# Patient Record
Sex: Male | Born: 1980 | Race: Black or African American | Hispanic: No | Smoking: Current every day smoker
Health system: Southern US, Community
[De-identification: ages and names within clinical notes are randomized; demographics above are authoritative.]

## PROBLEM LIST (undated history)

## (undated) DIAGNOSIS — R519 Headache, unspecified: Secondary | ICD-10-CM

## (undated) DIAGNOSIS — G43909 Migraine, unspecified, not intractable, without status migrainosus: Secondary | ICD-10-CM

## (undated) DIAGNOSIS — R51 Headache: Secondary | ICD-10-CM

## (undated) DIAGNOSIS — M549 Dorsalgia, unspecified: Secondary | ICD-10-CM

## (undated) HISTORY — PX: NO PAST SURGERIES: SHX2092

---

## 2013-07-05 ENCOUNTER — Encounter (HOSPITAL_COMMUNITY): Payer: Self-pay | Admitting: Emergency Medicine

## 2013-07-05 ENCOUNTER — Emergency Department (HOSPITAL_COMMUNITY)
Admission: EM | Admit: 2013-07-05 | Discharge: 2013-07-05 | Disposition: A | Discharge status: Home / Self Care | Payer: 59 | Attending: Emergency Medicine | Admitting: Emergency Medicine

## 2013-07-05 DIAGNOSIS — L02411 Cutaneous abscess of right axilla: Secondary | ICD-10-CM

## 2013-07-05 DIAGNOSIS — IMO0002 Reserved for concepts with insufficient information to code with codable children: Secondary | ICD-10-CM | POA: Insufficient documentation

## 2013-07-05 DIAGNOSIS — F17200 Nicotine dependence, unspecified, uncomplicated: Secondary | ICD-10-CM | POA: Insufficient documentation

## 2013-07-05 MED ORDER — SULFAMETHOXAZOLE-TRIMETHOPRIM 800-160 MG PO TABS: 1.0000 | ORAL_TABLET | Freq: Two times a day (BID) | ORAL | Status: DC

## 2013-07-05 MED ORDER — CEPHALEXIN 500 MG PO CAPS: 500.0000 mg | ORAL_CAPSULE | Freq: Four times a day (QID) | ORAL | Status: DC

## 2013-07-05 MED ORDER — HYDROCODONE-ACETAMINOPHEN 5-325 MG PO TABS: 1.0000 | ORAL_TABLET | ORAL | Status: DC | PRN

## 2013-07-05 NOTE — ED Provider Notes (Signed)
CSN: [id number]     Arrival date & time 07/05/13  1833 History   First MD Initiated Contact with Patient 07/05/13 1846     Chief Complaint  Patient presents with  . Abscess    HPI  Dylan Gill is a 32 y.o. male with no PMH who presents to the ED for evaluation of an abscess.  History was provided by the patient.  Patient states he has had small abscesses to the right axilla for the past two weeks.  He states that they have been enlarging, bursting, and then resolving spontaneously.  He states he developed another abscess to the right axilla yesterday.  The area is red, warm, and painful to the touch.  He denies any previous wounds or injuries.  No drainage or fever.  He has had abscesses in the past to his back which were surgically managed.  He has not been on antibiotics for the past few months.  He denise any history of diabetes.  Tetanus up to date per patient.  No weakness, loss of sensation, numbness, tingling, spreading erythema/edema, change in appetite/activity, abdominal pain, nausea, emesis, diarrhea, or leg edema.  He recently moved here and has financial concerns for his healthcare.     History reviewed. No pertinent past medical history. History reviewed. No pertinent past surgical history. No family history on file. History  Substance Use Topics  . Smoking status: Current Every Day Smoker  . Smokeless tobacco: Not on file  . Alcohol Use: No    Review of Systems  Constitutional: Negative for fever, chills, activity change, appetite change and fatigue.  HENT: Negative for mouth sores, trouble swallowing and voice change.   Eyes: Negative for visual disturbance.  Respiratory: Negative for cough and shortness of breath.   Cardiovascular: Negative for chest pain and leg swelling.  Gastrointestinal: Negative for nausea, vomiting, abdominal pain and diarrhea.  Genitourinary: Negative for dysuria.  Musculoskeletal: Negative for arthralgias, back pain, joint swelling, myalgias  and neck pain.  Skin: Positive for color change. Negative for wound.  Neurological: Negative for dizziness, syncope, weakness, light-headedness, numbness and headaches.  Psychiatric/Behavioral: Negative for confusion.    Allergies  Review of patient's allergies indicates no known allergies.  Home Medications  No current outpatient prescriptions on file. BP 138/77  Pulse 89  Temp(Src) 98.9 F (37.2 C) (Oral)  Resp 18  Wt 252 lb 4 oz (114.42 kg)  SpO2 97%  Filed Vitals:   07/05/13 1838 07/05/13 2009  BP: 138/77 120/77  Pulse: 89 77  Temp: 98.9 F (37.2 C) 97.9 F (36.6 C)  TempSrc: Oral Oral  Resp: 18 18  Weight: 252 lb 4 oz (114.42 kg)   SpO2: 97% 97%    Physical Exam  Nursing note and vitals reviewed. Constitutional: He is oriented to person, place, and time. He appears well-developed and well-nourished. No distress.  HENT:  Head: Normocephalic and atraumatic.  Right Ear: External ear normal.  Left Ear: External ear normal.  Nose: Nose normal.  Mouth/Throat: Oropharynx is clear and moist.  Eyes: Conjunctivae are normal. Pupils are equal, round, and reactive to light. Right eye exhibits no discharge. Left eye exhibits no discharge.  Neck: Normal range of motion. Neck supple.  Cardiovascular: Normal rate, regular rhythm, normal heart sounds and intact distal pulses.  Exam reveals no gallop and no friction rub.   No murmur heard. Radial pulses present bilaterally  Pulmonary/Chest: Effort normal and breath sounds normal. No respiratory distress. He has no wheezes. He has no  rales. He exhibits no tenderness.  Abdominal: Soft. He exhibits no distension. There is no tenderness.  Musculoskeletal: Normal range of motion. He exhibits no edema and no tenderness.  Strength 5/5 in the UE bilaterally  Neurological: He is alert and oriented to person, place, and time.  Sensation intact in the UE bilaterally  Skin: Skin is warm and dry. He is not diaphoretic. There is erythema.   1 cm x 1 cm circular mobile mass with overlying erythema to the right axilla.  Area is indurated with no fluctuance or open wounds.  Area is tender to palpation.      ED Course  Procedures (including critical care time) Labs Review Labs Reviewed - No data to display Imaging Review No results found.  EKG Interpretation   None       MDM   1. Abscess of right axilla     Dylan Gill is a 32 y.o. male with no PMH who presents to the ED for evaluation of an abscess.   Patient likely has a developing abscess to the right axilla.  Area is indurated with no area of fluctuance or open wounds.  I did not feel that I&D was indicated at this time.  Patient was prescribed Keflex and Bactrim for OP management and instructed to apply warm compresses.  He was given referral to surgery for OP management.  He was instructed to follow-up for wound recheck in two days.  Patient was non-toxic, afebrile, and remained in no acute distress throughout his ED visit.  He was also given a prescription for Norco for severe pain.  Patient was instructed to return to the ED if they experience any spreading redness/swelling, fever, severe pain, drainage of pus, or other concerns. Patient was in agreement with discharge and plan.     Final impressions: 1. Abscess, right axilla     Greer Ee Delaynie Stetzer PA-C         Jillyn Ledger, PA-C 07/06/13 1204

## 2013-07-05 NOTE — ED Notes (Signed)
Pt is here with abscess areas under right arm.

## 2013-07-10 NOTE — ED Provider Notes (Signed)
Medical screening examination/treatment/procedure(s) were performed by non-physician practitioner and as supervising physician I was immediately available for consultation/collaboration.  EKG Interpretation   None         Roney Marion, MD 07/10/13 1100

## 2015-08-31 ENCOUNTER — Emergency Department (HOSPITAL_COMMUNITY): Payer: Medicaid Other

## 2015-08-31 ENCOUNTER — Emergency Department (HOSPITAL_COMMUNITY)
Admission: EM | Admit: 2015-08-31 | Discharge: 2015-08-31 | Disposition: A | Discharge status: Home / Self Care | Payer: Medicaid Other | Attending: Emergency Medicine | Admitting: Emergency Medicine

## 2015-08-31 ENCOUNTER — Encounter (HOSPITAL_COMMUNITY): Payer: Self-pay | Admitting: *Deleted

## 2015-08-31 DIAGNOSIS — F17200 Nicotine dependence, unspecified, uncomplicated: Secondary | ICD-10-CM | POA: Insufficient documentation

## 2015-08-31 DIAGNOSIS — Z792 Long term (current) use of antibiotics: Secondary | ICD-10-CM | POA: Insufficient documentation

## 2015-08-31 DIAGNOSIS — M545 Low back pain, unspecified: Secondary | ICD-10-CM

## 2015-08-31 HISTORY — DX: Dorsalgia, unspecified: M54.9

## 2015-08-31 MED ORDER — CYCLOBENZAPRINE HCL 10 MG PO TABS: 10.0000 mg | ORAL_TABLET | Freq: Three times a day (TID) | ORAL | Status: DC | PRN

## 2015-08-31 MED ORDER — IBUPROFEN 800 MG PO TABS
800.0000 mg | ORAL_TABLET | Freq: Once | ORAL | Status: AC
  Administered 2015-08-31: 800 mg via ORAL
  Filled 2015-08-31: qty 1

## 2015-08-31 MED ORDER — IBUPROFEN 800 MG PO TABS: 800.0000 mg | ORAL_TABLET | Freq: Three times a day (TID) | ORAL | Status: DC | PRN

## 2015-08-31 NOTE — ED Notes (Signed)
Patient presents with c/o lower back pain

## 2015-08-31 NOTE — ED Notes (Signed)
MD at bedside. 

## 2015-08-31 NOTE — Discharge Instructions (Signed)
Return for worsening symptoms, including worsening pain, inability to walk, numbness/weakness of legs, loss of control of bowel or bladder or any other symptoms concerning to you. 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 You are given neurosurgery follow-up as needed for this.  Back Exercises If you have pain in your back, do these exercises 2-3 times each day or as told by your doctor. When the pain goes away, do the exercises once each day, but repeat the steps more times for each exercise (do more repetitions). If you do not have pain in your back, do these exercises once each day or as told by your doctor. EXERCISES Single Knee to Chest Do these steps 3-5 times in a row for each leg:  Lie on your back on a firm bed or the floor with your legs stretched out.  Bring one knee to your chest.  Hold your knee to your chest by grabbing your knee or thigh.  Pull on your knee until you feel a gentle stretch in your lower back.  Keep doing the stretch for 10-30 seconds.  Slowly let go of your leg and straighten it. Pelvic Tilt Do these steps 5-10 times in a row:  Lie on your back on a firm bed or the floor with your legs stretched out.  Bend your knees so they point up to the ceiling. Your feet should be flat on the floor.  Tighten your lower belly (abdomen) muscles to press your lower back against the floor. This will make your tailbone point up to the ceiling instead of pointing down to your feet or the floor.  Stay in this position for 5-10 seconds while you gently tighten your muscles and breathe evenly. Cat-Cow Do these steps until your lower back bends more easily:  Get on your hands and knees on a firm surface. Keep your hands under your shoulders, and keep your knees under your hips. You may put padding under your knees.  Let your head hang down, and make your tailbone point down to  the floor so your lower back is round like the back of a cat.  Stay in this position for 5 seconds.  Slowly lift your head and make your tailbone point up to the ceiling so your back hangs low (sags) like the back of a cow.  Stay in this position for 5 seconds. Press-Ups Do these steps 5-10 times in a row: 1. Lie on your belly (face-down) on the floor. 2. Place your hands near your head, about shoulder-width apart. 3. While you keep your back relaxed and keep your hips on the floor, slowly straighten your arms to raise the top half of your body and lift your shoulders. Do not use your back muscles. To make yourself more comfortable, you may change where you place your hands. 4. Stay in this position for 5 seconds. 5. Slowly return to lying flat on the floor. Bridges Do these steps 10 times in a row: 1. Lie on your back on a firm surface. 2. Bend your knees so they point up to the ceiling. Your feet should be flat on the floor. 3. Tighten your butt muscles and lift your butt off of the floor until your waist is almost as high as your knees. If you do not feel the muscles working in your butt and the back of your thighs, slide your feet 1-2 inches farther away  from your butt. 4. Stay in this position for 3-5 seconds. 5. Slowly lower your butt to the floor, and let your butt muscles relax. If this exercise is too easy, try doing it with your arms crossed over your chest. Belly Crunches Do these steps 5-10 times in a row: 1. Lie on your back on a firm bed or the floor with your legs stretched out. 2. Bend your knees so they point up to the ceiling. Your feet should be flat on the floor. 3. Cross your arms over your chest. 4. Tip your chin a little bit toward your chest but do not bend your neck. 5. Tighten your belly muscles and slowly raise your chest just enough to lift your shoulder blades a tiny bit off of the floor. 6. Slowly lower your chest and your head to the floor. Back Lifts Do  these steps 5-10 times in a row: 1. Lie on your belly (face-down) with your arms at your sides, and rest your forehead on the floor. 2. Tighten the muscles in your legs and your butt. 3. Slowly lift your chest off of the floor while you keep your hips on the floor. Keep the back of your head in line with the curve in your back. Look at the floor while you do this. 4. Stay in this position for 3-5 seconds. 5. Slowly lower your chest and your face to the floor. GET HELP IF:  Your back pain gets a lot worse when you do an exercise.  Your back pain does not lessen 2 hours after you exercise. If you have any of these problems, stop doing the exercises. Do not do them again unless your doctor says it is okay. GET HELP RIGHT AWAY IF:  You have sudden, very bad back pain. If this happens, stop doing the exercises. Do not do them again unless your doctor says it is okay.   This information is not intended to replace advice given to you by your health care provider. Make sure you discuss any questions you have with your health care provider.   Document Released: 09/28/2010 Document Revised: 05/17/2015 Document Reviewed: 10/20/2014 Elsevier Interactive Patient Education 2016 [employer]  Back Pain, Adult Back pain is very common. The pain often gets better over time. The cause of back pain is usually not dangerous. Most people can learn to manage their back pain on their own.  HOME CARE  Watch your back pain for any changes. The following actions may help to lessen any pain you are feeling:  Stay active. Start with short walks on flat ground if you can. Try to walk farther each day.  Exercise regularly as told by your doctor. Exercise helps your back heal faster. It also helps avoid future injury by keeping your muscles strong and flexible.  Do not sit, drive, or stand in one place for more than 30 minutes.  Do not stay in bed. Resting more than 1-2 days can slow down your recovery.  Be  careful when you bend or lift an object. Use good form when lifting:  Bend at your knees.  Keep the object close to your body.  Do not twist.  Sleep on a firm mattress. Lie on your side, and bend your knees. If you lie on your back, put a pillow under your knees.  Take medicines only as told by your doctor.  Put ice on the injured area.  Put ice in a plastic bag.  Place a towel between your  skin and the bag.  Leave the ice on for 20 minutes, 2-3 times a day for the first 2-3 days. After that, you can switch between ice and heat packs.  Avoid feeling anxious or stressed. Find good ways to deal with stress, such as exercise.  Maintain a healthy weight. Extra weight puts stress on your back. GET HELP IF:   You have pain that does not go away with rest or medicine.  You have worsening pain that goes down into your legs or buttocks.  You have pain that does not get better in one week.  You have pain at night.  You lose weight.  You have a fever or chills. GET HELP RIGHT AWAY IF:   You cannot control when you poop (bowel movement) or pee (urinate).  Your arms or legs feel weak.  Your arms or legs lose feeling (numbness).  You feel sick to your stomach (nauseous) or throw up (vomit).  You have belly (abdominal) pain.  You feel like you may pass out (faint).   This information is not intended to replace advice given to you by your health care provider. Make sure you discuss any questions you have with your health care provider.   Document Released: 02/12/2008 Document Revised: 09/16/2014 Document Reviewed: 12/28/2013 Elsevier Interactive Patient Education [employer] Inc[employer]

## 2015-08-31 NOTE — ED Provider Notes (Signed)
CSN: [id number]     Arrival date & time 08/31/15  0234 History   By signing my name below, I, Arlan Organ, attest that this documentation has been prepared under the direction and in the presence of Lavera Guise, MD.  Electronically Signed: Arlan Organ, ED Scribe. 08/31/2015. 2:47 AM.   Chief Complaint  Patient presents with  . Back Pain   The history is provided by the patient. No language interpreter was used.    HPI Comments: Dylan Gill is a 34 y.o. male with a PMHx of back pain who presents to the Emergency Department complaining of intermittent, ongoing lower back pain x 2-3 months. He denies any recent heavy lifting but states he is pretty active at work as he is on his feet often. Frequently lifts and carries his daughter around at home as well. No aggravating or alleviating factors at this time. OTC Tylenol attempted prior to arrival with mild temporary improvement. No recent fever ,chills, night sweats, nausea, vomiting, abdominal pain, diarrhea, or dysuria. No numbness, loss of sensation, or weakness. No bowel or urinary incontinence or urinary retention.  PCP: No PCP Per Patient    Past Medical History  Diagnosis Date  . Back pain    History reviewed. No pertinent past surgical history. History reviewed. No pertinent family history. Social History  Substance Use Topics  . Smoking status: Current Every Day Smoker  . Smokeless tobacco: Never Used  . Alcohol Use: Yes    Review of Systems  Constitutional: Negative for fever and chills.  Gastrointestinal: Negative for nausea, vomiting and abdominal pain.  Genitourinary: Negative for dysuria.  Musculoskeletal: Positive for back pain.  Neurological: Negative for weakness and numbness.  All other systems reviewed and are negative.     Allergies  Review of patient's allergies indicates no known allergies.  Home Medications   Prior to Admission medications   Medication Sig Start Date End Date Taking? Authorizing  Provider  cephALEXin (KEFLEX) 500 MG capsule Take 1 capsule (500 mg total) by mouth 4 (four) times daily. 07/05/13   Jillyn Ledger, PA-C  cyclobenzaprine (FLEXERIL) 10 MG tablet Take 1 tablet (10 mg total) by mouth 3 (three) times daily as needed for muscle spasms. 08/31/15   Lavera Guise, MD  HYDROcodone-acetaminophen (NORCO/VICODIN) 5-325 MG per tablet Take 1-2 tablets by mouth every 4 (four) hours as needed for pain. 07/05/13   Jillyn Ledger, PA-C  ibuprofen (ADVIL,MOTRIN) 800 MG tablet Take 1 tablet (800 mg total) by mouth every 8 (eight) hours as needed for moderate pain. Take with food/crackers 08/31/15   Lavera Guise, MD  sulfamethoxazole-trimethoprim (SEPTRA DS) 800-160 MG per tablet Take 1 tablet by mouth 2 (two) times daily. 07/05/13   Jillyn Ledger, PA-C   Triage Vitals: BP 126/78 mmHg  Pulse 86  Temp(Src) 98.9 F (37.2 C)  Resp 16  SpO2 100%   Physical Exam  Constitutional: He is oriented to person, place, and time. He appears well-developed and well-nourished.  HENT:  Head: Normocephalic and atraumatic.  Eyes: EOM are normal.  Neck: Normal range of motion.  Cardiovascular: Normal rate, regular rhythm, normal heart sounds and intact distal pulses.   Pulmonary/Chest: Effort normal and breath sounds normal. No respiratory distress.  Abdominal: Soft. Bowel sounds are normal. He exhibits no distension. There is no tenderness.  Musculoskeletal: Normal range of motion.  Midline and bilateral paraspinal tenderness involving the lumbar spine No stepoff or deformity  Neurological: He is alert and oriented to  person, place, and time.  Reflex Scores:      Patellar reflexes are 2+ on the right side and 2+ on the left side.      Achilles reflexes are 2+ on the right side and 2+ on the left side. Full sensation to light touch of the bilateral lower extremities Full strength to hips with flexion, extension, abduction, and adduction Full strength of knee flexion, dorsiflexion,  and plantar flexion bilaterally  Skin: Skin is warm and dry.  Psychiatric: He has a normal mood and affect. Judgment normal.  Nursing note and vitals reviewed.   ED Course  Procedures (including critical care time)  DIAGNOSTIC STUDIES: Oxygen Saturation is 100% on RA, Normal by my interpretation.    COORDINATION OF CARE: 2:43 AM- Will give Advil. Will order DG lumbar spine complete. Discussed treatment plan with pt at bedside and pt agreed to plan.     Labs Review Labs Reviewed - No data to display  Imaging Review Dg Lumbar Spine Complete  08/31/2015  CLINICAL DATA:  Lower back pain for months. EXAM: LUMBAR SPINE - COMPLETE 4+ VIEW COMPARISON:  None. FINDINGS: No evidence of acute fracture or traumatic malalignment. Absent/small right twelfth rib. No evidence of bone lesion or endplate erosion. 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 IMPRESSION: 1. No acute finding. 2. 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 Correlate for symptoms of Bertolotti's syndrome. Electronically Signed   By: Marnee SpringJonathon  Watts M.D.   On: 08/31/2015 03:34   I have personally reviewed and evaluated these images and lab results as part of my medical decision-making.   MDM   Final diagnoses:  Bilateral low back pain without sciatica    34 year old male who presents with several months of ongoing low back pain. He is well-appearing and in no acute distress. Has unremarkable vital signs. Is neurovascularly intact in his lower extremities. Presentation seems consistent with likely low back strain. No concern for serious or life-threatening cause of his symptoms such as spinal cord involvement, infection, malignancy, bleeding, intra-abdominal or retroperitoneal process. Given ongoing symptoms now for almost 3 months, x-ray of his lumbar spine was performed. Findings may suggest possible Bertolotti's  syndrome, and he is given neurosurgery referral as needed. I discussed supportive care instructions for home. Strict return and follow-up instructions are reviewed. He expressed understanding of all discharge instructions and felt comfortable with the plan of care.  I personally performed the services described in this documentation, which was scribed in my presence. The recorded information has been reviewed and is accurate.   Lavera Guiseana Duo Dima Mini, MD 08/31/15 [id number]

## 2015-08-31 NOTE — ED Notes (Signed)
Patient transported to X-ray 

## 2016-05-13 ENCOUNTER — Encounter (HOSPITAL_COMMUNITY): Payer: Self-pay

## 2016-05-13 ENCOUNTER — Emergency Department (HOSPITAL_COMMUNITY)
Admission: EM | Admit: 2016-05-13 | Discharge: 2016-05-13 | Disposition: A | Discharge status: Home / Self Care | Payer: Medicaid Other | Attending: Emergency Medicine | Admitting: Emergency Medicine

## 2016-05-13 DIAGNOSIS — F17210 Nicotine dependence, cigarettes, uncomplicated: Secondary | ICD-10-CM | POA: Insufficient documentation

## 2016-05-13 DIAGNOSIS — Y929 Unspecified place or not applicable: Secondary | ICD-10-CM | POA: Insufficient documentation

## 2016-05-13 DIAGNOSIS — Y939 Activity, unspecified: Secondary | ICD-10-CM | POA: Diagnosis not present

## 2016-05-13 DIAGNOSIS — Y999 Unspecified external cause status: Secondary | ICD-10-CM | POA: Diagnosis not present

## 2016-05-13 DIAGNOSIS — T23202A Burn of second degree of left hand, unspecified site, initial encounter: Secondary | ICD-10-CM | POA: Diagnosis present

## 2016-05-13 DIAGNOSIS — X12XXXA Contact with other hot fluids, initial encounter: Secondary | ICD-10-CM | POA: Insufficient documentation

## 2016-05-13 DIAGNOSIS — Z23 Encounter for immunization: Secondary | ICD-10-CM | POA: Insufficient documentation

## 2016-05-13 MED ORDER — SILVER SULFADIAZINE 1 % EX CREA
TOPICAL_CREAM | Freq: Once | CUTANEOUS | Status: AC
  Administered 2016-05-13: 22:00:00 via TOPICAL
  Filled 2016-05-13: qty 85

## 2016-05-13 MED ORDER — KETOROLAC TROMETHAMINE 30 MG/ML IJ SOLN
30.0000 mg | Freq: Once | INTRAMUSCULAR | Status: AC
  Administered 2016-05-13: 30 mg via INTRAMUSCULAR
  Filled 2016-05-13: qty 1

## 2016-05-13 MED ORDER — SILVER SULFADIAZINE 1 % EX CREA: 1.0000 "application " | TOPICAL_CREAM | Freq: Two times a day (BID) | CUTANEOUS | 0 refills | Status: DC

## 2016-05-13 MED ORDER — TETANUS-DIPHTH-ACELL PERTUSSIS 5-2.5-18.5 LF-MCG/0.5 IM SUSP
0.5000 mL | Freq: Once | INTRAMUSCULAR | Status: AC
  Administered 2016-05-13: 0.5 mL via INTRAMUSCULAR
  Filled 2016-05-13: qty 0.5

## 2016-05-13 MED ORDER — KETOROLAC TROMETHAMINE 10 MG PO TABS: 10.0000 mg | ORAL_TABLET | Freq: Four times a day (QID) | ORAL | 0 refills | Status: DC | PRN

## 2016-05-13 MED ORDER — HYDROCODONE-ACETAMINOPHEN 5-325 MG PO TABS: 1.0000 | ORAL_TABLET | ORAL | 0 refills | Status: DC | PRN
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
## 2016-05-13 NOTE — ED Notes (Signed)
No reaction noted.
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
## 2017-02-05 ENCOUNTER — Emergency Department (HOSPITAL_COMMUNITY)
Admission: EM | Admit: 2017-02-05 | Discharge: 2017-02-05 | Disposition: A | Discharge status: Home / Self Care | Payer: Medicaid Other | Attending: Emergency Medicine | Admitting: Emergency Medicine

## 2017-02-05 ENCOUNTER — Encounter (HOSPITAL_COMMUNITY): Payer: Self-pay | Admitting: Emergency Medicine

## 2017-02-05 DIAGNOSIS — L732 Hidradenitis suppurativa: Secondary | ICD-10-CM | POA: Insufficient documentation

## 2017-02-05 DIAGNOSIS — R229 Localized swelling, mass and lump, unspecified: Secondary | ICD-10-CM | POA: Diagnosis present

## 2017-02-05 DIAGNOSIS — F17210 Nicotine dependence, cigarettes, uncomplicated: Secondary | ICD-10-CM | POA: Diagnosis not present

## 2017-02-05 MED ORDER — CLINDAMYCIN PHOSPHATE 1 % EX SOLN: Freq: Two times a day (BID) | CUTANEOUS | 0 refills | Status: DC

## 2017-02-05 MED ORDER — LIDOCAINE-EPINEPHRINE (PF) 2 %-1:200000 IJ SOLN: 10.0000 mL | Freq: Once | INTRAMUSCULAR | Status: DC

## 2017-02-05 MED ORDER — HYDROCODONE-ACETAMINOPHEN 5-325 MG PO TABS: 1.0000 | ORAL_TABLET | Freq: Four times a day (QID) | ORAL | 0 refills | Status: DC | PRN

## 2017-02-05 NOTE — ED Triage Notes (Signed)
Denies fevers

## 2017-02-05 NOTE — Discharge Instructions (Signed)
You may have a condition called hydradenitis. Use the clindamycin solution twice a day applied to the area. May use ibuprofen, naproxen, or Tylenol for pain. Vicodin for severe pain. Do not drive or perform other dangerous activities while taking the Vicodin. Follow-up with dermatology as soon as possible. Call one of the numbers included in this packet to set up an appointment. You may also find your own dermatologist. Establishing care with this specialist is very important for care of this sometimes complex condition.

## 2017-02-05 NOTE — ED Triage Notes (Signed)
Pt to ER for abscess under left axilla x "weeks." pt reports minimal drainage.

## 2017-02-05 NOTE — ED Provider Notes (Signed)
[facility] Provider Note   CSN: [id number] Arrival date & time: 02/05/17  1257  By signing my name below, I, Rosario AdieWilliam Andrew Hiatt, attest that this documentation has been prepared under the direction and in the presence of Shawn Joy, PA-C.  Electronically Signed: Rosario AdieWilliam Andrew Hiatt, ED Scribe. 02/05/17. 3:09 PM.  History   Chief Complaint Chief Complaint  Patient presents with  . Abscess   The history is provided by the patient. No language interpreter was used.    HPI Comments: Dylan Gill is a 36 y.o. male who presents to the Emergency Department complaining of multiple gradually worsening area of pain and swelling to the left axilla onset 2 weeks ago. He reports that one of these areas began draining several days ago, but the other areas have not. He has a h/o prior abscesses to the area and states that his symptoms today are consistent with this. Has also had similar eruptions on his back. Pt states pain is exacerbated with palpation. No noted treatments for his symptoms were tried prior to coming into the ED. Denies fever, chills, or any other associated symptoms. Tetanus is UTD.    Patient denies similar areas of swelling to the groin, right axilla, or other body areas.   Past Medical History:  Diagnosis Date  . Back pain    There are no active problems to display for this patient.  History reviewed. No pertinent surgical history.   Home Medications    Prior to Admission medications   Medication Sig Start Date End Date Taking? Authorizing Provider  acetaminophen (TYLENOL) 650 MG CR tablet Take 1,300 mg by mouth every 8 (eight) hours as needed for pain.   Yes [provider]  clindamycin (CLEOCIN-T) 1 % external solution Apply topically 2 (two) times daily. 02/05/17   Joy, Shawn C, PA-C  HYDROcodone-acetaminophen (NORCO/VICODIN) 5-325 MG tablet Take 1 tablet by mouth every 6 (six) hours as needed. 02/05/17   Joy, Shawn C, PA-C  ketorolac (TORADOL) 10  MG tablet Take 1 tablet (10 mg total) by mouth every 6 (six) hours as needed. Patient not taking: Reported on 02/05/2017 05/13/16   Jacalyn LefevreHaviland, Julie, MD  silver sulfADIAZINE (SILVADENE) 1 % cream Apply 1 application topically 2 (two) times daily. Apply to left hand Patient not taking: Reported on 02/05/2017 05/13/16   Jacalyn LefevreHaviland, Julie, MD    Family History History reviewed. No pertinent family history.  Social History Social History  Substance Use Topics  . Smoking status: Current Every Day Smoker    Packs/day: 1.00    Types: Cigarettes  . Smokeless tobacco: Never Used  . Alcohol use Yes     Comment: occasiona.    Allergies   Patient has no known allergies.  Review of Systems Review of Systems  Constitutional: Negative for chills and fever.  Skin: Negative for wound.       +areas of pain and swelling to the left axilla   Physical Exam Updated Vital Signs BP 129/71 (BP Location: Right Arm)   Pulse 68   Temp 98.1 F (36.7 C) (Oral)   Resp 16   SpO2 98%   Physical Exam  Constitutional: He appears well-developed and well-nourished. No distress.  HENT:  Head: Normocephalic and atraumatic.  Eyes: Conjunctivae are normal.  Neck: Neck supple.  Cardiovascular: Normal rate, regular rhythm and intact distal pulses.   Pulmonary/Chest: Effort normal.  Lymphadenopathy:    He has no cervical adenopathy.  Neurological: He is alert.  Skin: Skin is warm and  dry. He is not diaphoretic.  Multiple tender, indurated masses in the left axilla. Areas are firm. No areas of fluctuance noted.  Psychiatric: He has a normal mood and affect. His behavior is normal.  Nursing note and vitals reviewed.  ED Treatments / Results  COORDINATION OF CARE: 3:09 PM-Discussed next steps with pt. Pt verbalized understanding and is agreeable with the plan.   Labs (all labs ordered are listed, but only abnormal results are displayed) Labs Reviewed - No data to display  EKG  EKG Interpretation None        Radiology No results found.  Procedures Procedures (including critical care time)  EMERGENCY DEPARTMENT US SOFT TISSUE INTERPRETATION "Study: Limited Soft Tissue Ultrasound"  INDICATIONS: Pain Multiple views of the body part were obtained in real-time with a multi-frequency linear probe  PERFORMED BY: Myself IMAGES ARCHIVED?: Yes SIDE:Left BODY PART:Axilla INTERPRETATION:  No central areas of fluid collection.    Medications Ordered in ED Medications  lidocaine-EPINEPHrine (XYLOCAINE W/EPI) 2 %-1:200000 (PF) injection 10 mL (not administered)     Initial Impression / Assessment and Plan / ED Course  I have reviewed the triage vital signs and the nursing notes.  Pertinent labs & imaging results that were available during my care of the patient were reviewed by me and considered in my medical decision making (see chart for details).     Patient presents with a cluster of masses on the left axilla. Suspicious for hydradenitis. As such, area inappropriate for I&D. Topical antibiotic and dermatology follow-up.   Findings and plan of care discussed with Vanetta Mulders, MD. Dr. Deretha Emory personally evaluated and examined this patient.     Final Clinical Impressions(s) / ED Diagnoses   Final diagnoses:  Hydradenitis   New Prescriptions Discharge Medication List as of 02/05/2017  4:34 PM    START taking these medications   Details  clindamycin (CLEOCIN-T) 1 % external solution Apply topically 2 (two) times daily., Starting Wed 02/05/2017, Print      I personally performed the services described in this documentation, which was scribed in my presence. The recorded information has been reviewed and is accurate.    Anselm Pancoast, PA-C 02/05/17 1719    Vanetta Mulders, MD 02/05/17 [phone]

## 2017-11-25 ENCOUNTER — Ambulatory Visit
Admission: RE | Admit: 2017-11-25 | Discharge: 2017-11-25 | Disposition: A | Discharge status: Home / Self Care | Payer: Medicaid Other | Source: Ambulatory Visit | Attending: Family Medicine | Admitting: Family Medicine

## 2017-11-25 ENCOUNTER — Other Ambulatory Visit: Payer: Self-pay | Admitting: Family Medicine

## 2017-11-25 DIAGNOSIS — M545 Low back pain: Secondary | ICD-10-CM

## 2017-12-01 ENCOUNTER — Encounter (HOSPITAL_BASED_OUTPATIENT_CLINIC_OR_DEPARTMENT_OTHER): Payer: Self-pay | Admitting: Emergency Medicine

## 2017-12-01 ENCOUNTER — Inpatient Hospital Stay (HOSPITAL_BASED_OUTPATIENT_CLINIC_OR_DEPARTMENT_OTHER)
Admission: EM | Admit: 2017-12-01 | Discharge: 2017-12-04 | DRG: 473 | Disposition: A | Discharge status: Home / Self Care | Payer: Medicaid Other | Attending: Neurosurgery | Admitting: Neurosurgery

## 2017-12-01 ENCOUNTER — Other Ambulatory Visit: Payer: Self-pay

## 2017-12-01 DIAGNOSIS — M545 Low back pain: Secondary | ICD-10-CM

## 2017-12-01 DIAGNOSIS — M4802 Spinal stenosis, cervical region: Secondary | ICD-10-CM | POA: Diagnosis not present

## 2017-12-01 DIAGNOSIS — W182XXA Fall in (into) shower or empty bathtub, initial encounter: Secondary | ICD-10-CM | POA: Diagnosis not present

## 2017-12-01 DIAGNOSIS — M50223 Other cervical disc displacement at C6-C7 level: Secondary | ICD-10-CM | POA: Diagnosis not present

## 2017-12-01 DIAGNOSIS — Z419 Encounter for procedure for purposes other than remedying health state, unspecified: Secondary | ICD-10-CM

## 2017-12-01 DIAGNOSIS — R159 Full incontinence of feces: Secondary | ICD-10-CM | POA: Diagnosis present

## 2017-12-01 DIAGNOSIS — M2578 Osteophyte, vertebrae: Secondary | ICD-10-CM | POA: Diagnosis not present

## 2017-12-01 DIAGNOSIS — Z79899 Other long term (current) drug therapy: Secondary | ICD-10-CM

## 2017-12-01 DIAGNOSIS — Y93E1 Activity, personal bathing and showering: Secondary | ICD-10-CM | POA: Diagnosis not present

## 2017-12-01 DIAGNOSIS — M4712 Other spondylosis with myelopathy, cervical region: Principal | ICD-10-CM | POA: Diagnosis present

## 2017-12-01 DIAGNOSIS — Z79891 Long term (current) use of opiate analgesic: Secondary | ICD-10-CM

## 2017-12-01 DIAGNOSIS — G959 Disease of spinal cord, unspecified: Secondary | ICD-10-CM | POA: Diagnosis present

## 2017-12-01 DIAGNOSIS — F17210 Nicotine dependence, cigarettes, uncomplicated: Secondary | ICD-10-CM | POA: Diagnosis not present

## 2017-12-01 DIAGNOSIS — R202 Paresthesia of skin: Secondary | ICD-10-CM | POA: Diagnosis present

## 2017-12-01 HISTORY — DX: Headache, unspecified: R51.9

## 2017-12-01 HISTORY — DX: Headache: R51

## 2017-12-01 HISTORY — DX: Migraine, unspecified, not intractable, without status migrainosus: G43.909

## 2017-12-01 LAB — COMPREHENSIVE METABOLIC PANEL
ALT: 12 U/L — ABNORMAL LOW (ref 17–63)
AST: 15 U/L (ref 15–41)
Albumin: 4.1 g/dL (ref 3.5–5.0)
Alkaline Phosphatase: 86 U/L (ref 38–126)
Anion gap: 8 (ref 5–15)
BUN: 10 mg/dL (ref 6–20)
CO2: 23 mmol/L (ref 22–32)
CREATININE: 0.85 mg/dL (ref 0.61–1.24)
Calcium: 9.2 mg/dL (ref 8.9–10.3)
Chloride: 106 mmol/L (ref 101–111)
GFR calc non Af Amer: >60 mL/min (ref >60)
Glucose, Bld: 87 mg/dL (ref 65–99)
POTASSIUM: 3.6 mmol/L (ref 3.5–5.1)
Sodium: 137 mmol/L (ref 135–145)
Total Bilirubin: 0.6 mg/dL (ref 0.3–1.2)
Total Protein: 6.9 g/dL (ref 6.5–8.1)

## 2017-12-01 LAB — LIPASE, BLOOD: LIPASE: 29 U/L (ref 11–51)

## 2017-12-01 LAB — CBC
HEMATOCRIT: 39.7 % (ref 39.0–52.0)
Hemoglobin: 13.3 g/dL (ref 13.0–17.0)
MCH: 32.5 pg (ref 26.0–34.0)
MCHC: 33.5 g/dL (ref 30.0–36.0)
MCV: 97.1 fL (ref 78.0–100.0)
PLATELETS: 261 10*3/uL (ref 150–400)
RBC: 4.09 MIL/uL — ABNORMAL LOW (ref 4.22–5.81)
RDW: 11.7 % (ref 11.5–15.5)
WBC: 8.8 10*3/uL (ref 4.0–10.5)

## 2017-12-01 MED ORDER — MORPHINE SULFATE (PF) 4 MG/ML IV SOLN
4.0000 mg | Freq: Once | INTRAVENOUS | Status: AC
  Administered 2017-12-01: 4 mg via INTRAVENOUS
  Filled 2017-12-01: qty 1

## 2017-12-01 MED ORDER — KETOROLAC TROMETHAMINE 30 MG/ML IJ SOLN
30.0000 mg | Freq: Once | INTRAMUSCULAR | Status: AC
  Administered 2017-12-01: 30 mg via INTRAVENOUS
  Filled 2017-12-01: qty 1

## 2017-12-01 MED ORDER — METHOCARBAMOL 500 MG PO TABS
500.0000 mg | ORAL_TABLET | Freq: Once | ORAL | Status: AC
  Administered 2017-12-01: 500 mg via ORAL
  Filled 2017-12-01: qty 1

## 2017-12-01 MED ORDER — ONDANSETRON HCL 4 MG/2ML IJ SOLN
4.0000 mg | Freq: Once | INTRAMUSCULAR | Status: AC
  Administered 2017-12-01: 4 mg via INTRAVENOUS
  Filled 2017-12-01: qty 2

## 2017-12-01 NOTE — ED Notes (Signed)
Called [facility] for ED to ED transfer to [place]. 

## 2017-12-01 NOTE — ED Provider Notes (Signed)
[facility] EMERGENCY DEPARTMENT Provider Note   CSN: [id number] Arrival date & time: 12/01/17  1718     History   Chief Complaint Chief Complaint  Patient presents with  . Leg Pain    HPI Dylan Gill is a 37 y.o. male.  Dylan Gill is a 37 y.o. Male who presents to the ED for 2 weeks of worsening low back pain.  Patient reports he has had associated tingling and numbness in his feet and lower legs, and reports he has had multiple falls, feels like his legs are weak and just come out from under him.  Last fall was this morning while he was in the shower.  Patient reports he feels that his right leg is stronger than his left.  Patient also reports some tingling and paresthesias of the perineum.  Reports over the past few days he has had episodes of incontinence of bowel, and frequently feels tightness and and lower abdominal discomfort and like he cannot empty his bladder or has to wait to the last second to go. Pt reports he initially saw his PCP when symptoms started and she started him on toradol and flexeril, which has not helped and paraesthesias and incontinence has developed since.  Patient denies fevers or chills aside from tightness over his bladder he denies any other abdominal pain, no dysuria, frequency or hematuria.  He denies any personal history of cancer or IV drug use.     Past Medical History:  Diagnosis Date  . Back pain     There are no active problems to display for this patient.   History reviewed. No pertinent surgical history.      Home Medications    Prior to Admission medications   Medication Sig Start Date End Date Taking? Authorizing Provider  Cyclobenzaprine HCl (FLEXERIL PO) Take by mouth.   Yes [provider]  acetaminophen (TYLENOL) 650 MG CR tablet Take 1,300 mg by mouth every 8 (eight) hours as needed for pain.    [provider]  clindamycin (CLEOCIN-T) 1 % external solution Apply topically 2 (two) times  daily. 02/05/17   Joy, Shawn C, PA-C  HYDROcodone-acetaminophen (NORCO/VICODIN) 5-325 MG tablet Take 1 tablet by mouth every 6 (six) hours as needed. 02/05/17   Joy, Shawn C, PA-C  ketorolac (TORADOL) 10 MG tablet Take 1 tablet (10 mg total) by mouth every 6 (six) hours as needed. Patient not taking: Reported on 02/05/2017 05/13/16   Jacalyn LefevreHaviland, Julie, MD  silver sulfADIAZINE (SILVADENE) 1 % cream Apply 1 application topically 2 (two) times daily. Apply to left hand Patient not taking: Reported on 02/05/2017 05/13/16   Jacalyn LefevreHaviland, Julie, MD    Family History History reviewed. No pertinent family history.  Social History Social History   Tobacco Use  . Smoking status: Current Every Day Smoker    Packs/day: 1.00    Types: Cigarettes  . Smokeless tobacco: Never Used  Substance Use Topics  . Alcohol use: Yes    Comment: occasiona.   . Drug use: Yes    Types: Marijuana     Allergies   Patient has no known allergies.   Review of Systems Review of Systems  Constitutional: Negative for chills and fever.  HENT: Negative for congestion, rhinorrhea and sore throat.   Eyes: Negative for visual disturbance.  Respiratory: Negative for cough, chest tightness and shortness of breath.   Cardiovascular: Negative for chest pain.  Gastrointestinal: Positive for abdominal pain. Negative for abdominal distention, blood in stool, constipation,  diarrhea, nausea and vomiting.  Genitourinary: Positive for difficulty urinating. Negative for dysuria, flank pain, frequency and hematuria.  Musculoskeletal: Positive for back pain and gait problem. Negative for arthralgias, joint swelling, neck pain and neck stiffness.  Skin: Negative for color change and rash.  Neurological: Positive for weakness and numbness. Negative for dizziness, tremors, seizures, syncope, facial asymmetry, speech difficulty, light-headedness and headaches.     Physical Exam Updated Vital Signs BP 113/75 (BP Location: Right Arm)   Pulse  91   Temp 99.3 F (37.4 C) (Oral)   Resp 16   Ht 6' 1.5" (1.867 m)   Wt 108.9 kg (240 lb)   SpO2 99%   BMI 31.23 kg/m   Physical Exam  Constitutional: He appears well-developed and well-nourished. No distress.  HENT:  Head: Normocephalic and atraumatic.  Eyes: Right eye exhibits no discharge. Left eye exhibits no discharge.  Neck: Normal range of motion. Neck supple.  Cardiovascular: Normal rate, regular rhythm, normal heart sounds and intact distal pulses.  Pulmonary/Chest: Effort normal and breath sounds normal. No stridor. No respiratory distress. He has no wheezes. He has no rales.  Abdominal: Soft. Bowel sounds are normal. He exhibits no distension and no mass. There is tenderness. There is no guarding.  Mild suprapubic tenderness without guarding, all other quadrants nontender to palpation, no rebound tenderness or peritoneal signs  Genitourinary:  Genitourinary Comments: Chaperone present during rectal exam.  Normal rectal tone.  Soft brown stool present in the rectal vault, no external lesions.  Musculoskeletal: He exhibits no edema, tenderness or deformity.  Neurological: He is alert. Coordination normal.  Speech is clear, able to follow commands CN III-XII intact Normal strength in upper extremities, 5/5 grip strength Lower extremities with good strength against gravity bilaterally, right side slightly stronger against resistance when compared to the left Sensation intact throughout lower extremities, patient reports increased sensation on the right when compared to the left and paresthesias. DTRs 2+ bilaterally in LE Moves extremities without ataxia, coordination intact  Skin: Skin is warm and dry. Capillary refill takes less than 2 seconds. He is not diaphoretic.  Psychiatric: He has a normal mood and affect. His behavior is normal.  Nursing note and vitals reviewed.    ED Treatments / Results  Labs (all labs ordered are listed, but only abnormal results are  displayed) Labs Reviewed  CBC - Abnormal; Notable for the following components:      Result Value   RBC 4.09 (*)    All other components within normal limits  COMPREHENSIVE METABOLIC PANEL - Abnormal; Notable for the following components:   ALT 12 (*)    All other components within normal limits  LIPASE, BLOOD  URINALYSIS, ROUTINE W REFLEX MICROSCOPIC    EKG None  Radiology No results found.  Procedures Procedures (including critical care time)  Medications Ordered in ED Medications  morphine 4 MG/ML injection 4 mg (4 mg Intravenous Given 12/01/17 2353)  ondansetron (ZOFRAN) injection 4 mg (4 mg Intravenous Given 12/01/17 2353)  ketorolac (TORADOL) 30 MG/ML injection 30 mg (30 mg Intravenous Given 12/01/17 2354)  methocarbamol (ROBAXIN) tablet 500 mg (500 mg Oral Given 12/01/17 2354)     Initial Impression / Assessment and Plan / ED Course  I have reviewed the triage vital signs and the nursing notes.  Pertinent labs & imaging results that were available during my care of the patient were reviewed by me and considered in my medical decision making (see chart for details).  Patient presents to  the ED for evaluation of worsening low back pain with associated paresthesias of bilateral lower legs and feet, patient reports 2 episodes of stool incontinence, difficulty releasing his urine and some saddle anesthesias.  Before neurologic symptoms developed he was seen by his PCP started on Toradol and Flexeril with no improvement in symptoms.  Patient reports he has been having increased falls due to weakness in the legs.  On exam patient with mild suprapubic tenderness will get abdominal labs and urinalysis.  Neuro exam shows decreased sensation and slight decrease in strength on the left side, normal rectal tone and DTRs intact.   Labs overall reassuring, no leukocytosis and normal hemoglobin, no electrolyte derangements, normal lipase, urinalysis pending.  Bladder scan showed 250 this was  shortly after patient voided.  Description of symptoms of bladder tightness and difficulty initiating urination still concerning for urinary retention.  Patient discussed with Dr. Fayrene Fearing feel patient will need MRI, plan to do MRI with and without contrast of entire spine to look for lesions that could be causing patient's symptoms.  Will plan for transfer to [facility] ED for MRI.  Spoke with Dr. Claiborne Rigg at [facility] ED who accepts patient in transfer.  Patient will likely need a neurology consult after MRI results returned.  Pain managed here in the ED.  Patient in no acute distress at time of transfer.  Pt discussed with Dr. Fayrene Fearing who is in agreement with plan.  Final Clinical Impressions(s) / ED Diagnoses   Final diagnoses:  Acute low back pain, unspecified back pain laterality, with sciatica presence unspecified  Paresthesias    ED Discharge Orders    None       Legrand Rams 12/02/17 0016    Rolland Porter, MD 12/02/17 [phone]

## 2017-12-01 NOTE — ED Triage Notes (Signed)
Reports bilateral leg "tingling".  States he has been falling due to this.  Reports this began "a few days ago".  Denies injury.  Reports normally ambulatory without issues.

## 2017-12-01 NOTE — ED Notes (Signed)
Chaperoned rectal exam with PA.  Pt tolerated well

## 2017-12-02 ENCOUNTER — Emergency Department (HOSPITAL_COMMUNITY): Payer: Medicaid Other

## 2017-12-02 ENCOUNTER — Encounter (HOSPITAL_COMMUNITY): Payer: Self-pay | Admitting: General Practice

## 2017-12-02 ENCOUNTER — Encounter (HOSPITAL_COMMUNITY): Payer: Self-pay | Admitting: Anesthesiology

## 2017-12-02 DIAGNOSIS — M4712 Other spondylosis with myelopathy, cervical region: Secondary | ICD-10-CM | POA: Diagnosis present

## 2017-12-02 DIAGNOSIS — F17210 Nicotine dependence, cigarettes, uncomplicated: Secondary | ICD-10-CM | POA: Diagnosis present

## 2017-12-02 DIAGNOSIS — R159 Full incontinence of feces: Secondary | ICD-10-CM | POA: Diagnosis present

## 2017-12-02 DIAGNOSIS — Z79891 Long term (current) use of opiate analgesic: Secondary | ICD-10-CM | POA: Diagnosis not present

## 2017-12-02 DIAGNOSIS — Z79899 Other long term (current) drug therapy: Secondary | ICD-10-CM | POA: Diagnosis not present

## 2017-12-02 DIAGNOSIS — Y93E1 Activity, personal bathing and showering: Secondary | ICD-10-CM | POA: Diagnosis not present

## 2017-12-02 DIAGNOSIS — R202 Paresthesia of skin: Secondary | ICD-10-CM | POA: Diagnosis present

## 2017-12-02 DIAGNOSIS — M50223 Other cervical disc displacement at C6-C7 level: Secondary | ICD-10-CM | POA: Diagnosis present

## 2017-12-02 DIAGNOSIS — M2578 Osteophyte, vertebrae: Secondary | ICD-10-CM | POA: Diagnosis present

## 2017-12-02 DIAGNOSIS — W182XXA Fall in (into) shower or empty bathtub, initial encounter: Secondary | ICD-10-CM | POA: Diagnosis present

## 2017-12-02 DIAGNOSIS — M4802 Spinal stenosis, cervical region: Secondary | ICD-10-CM | POA: Diagnosis present

## 2017-12-02 DIAGNOSIS — G959 Disease of spinal cord, unspecified: Secondary | ICD-10-CM | POA: Diagnosis present

## 2017-12-02 LAB — URINALYSIS, ROUTINE W REFLEX MICROSCOPIC
BILIRUBIN URINE: NEGATIVE
Glucose, UA: NEGATIVE mg/dL
Hgb urine dipstick: NEGATIVE
Ketones, ur: NEGATIVE mg/dL
Leukocytes, UA: NEGATIVE
NITRITE: NEGATIVE
PH: 6.5 (ref 5.0–8.0)
Protein, ur: NEGATIVE mg/dL
Specific Gravity, Urine: 1.01 (ref 1.005–1.030)

## 2017-12-02 MED ORDER — ACETAMINOPHEN 325 MG PO TABS: 650.0000 mg | ORAL_TABLET | Freq: Four times a day (QID) | ORAL | Status: DC | PRN

## 2017-12-02 MED ORDER — CYCLOBENZAPRINE HCL 10 MG PO TABS
10.0000 mg | ORAL_TABLET | Freq: Three times a day (TID) | ORAL | Status: DC | PRN
  Administered 2017-12-03: 10 mg via ORAL
  Filled 2017-12-02: qty 1

## 2017-12-02 MED ORDER — ONDANSETRON HCL 4 MG/2ML IJ SOLN
4.0000 mg | Freq: Four times a day (QID) | INTRAMUSCULAR | Status: DC | PRN
  Administered 2017-12-03: 4 mg via INTRAVENOUS

## 2017-12-02 MED ORDER — HYDROCODONE-ACETAMINOPHEN 5-325 MG PO TABS: 1.0000 | ORAL_TABLET | Freq: Four times a day (QID) | ORAL | Status: DC | PRN

## 2017-12-02 MED ORDER — HYDROCODONE-ACETAMINOPHEN 5-325 MG PO TABS
1.0000 | ORAL_TABLET | ORAL | Status: DC | PRN
  Administered 2017-12-03: 2 via ORAL
  Filled 2017-12-02: qty 2

## 2017-12-02 MED ORDER — LORAZEPAM 2 MG/ML IJ SOLN
1.0000 mg | Freq: Once | INTRAMUSCULAR | Status: AC
  Administered 2017-12-02: 1 mg via INTRAVENOUS
  Filled 2017-12-02: qty 1

## 2017-12-02 MED ORDER — GADOBENATE DIMEGLUMINE 529 MG/ML IV SOLN
20.0000 mL | Freq: Once | INTRAVENOUS | Status: AC
  Administered 2017-12-02: 20 mL via INTRAVENOUS

## 2017-12-02 MED ORDER — ONDANSETRON HCL 4 MG PO TABS: 4.0000 mg | ORAL_TABLET | Freq: Four times a day (QID) | ORAL | Status: DC | PRN

## 2017-12-02 MED ORDER — CEFAZOLIN SODIUM-DEXTROSE 2-4 GM/100ML-% IV SOLN
2.0000 g | INTRAVENOUS | Status: AC
  Administered 2017-12-03: 2 g via INTRAVENOUS

## 2017-12-02 MED ORDER — ADULT MULTIVITAMIN W/MINERALS CH
1.0000 | ORAL_TABLET | Freq: Every day | ORAL | Status: DC
  Administered 2017-12-02: 1 via ORAL
  Filled 2017-12-02: qty 1

## 2017-12-02 MED ORDER — ACETAMINOPHEN 650 MG RE SUPP: 650.0000 mg | Freq: Four times a day (QID) | RECTAL | Status: DC | PRN

## 2017-12-02 NOTE — ED Notes (Signed)
Pt returned from MRI °

## 2017-12-02 NOTE — ED Provider Notes (Signed)
Handoff from PulaskiMitchell PA-C at shift change.  [facility] given progressively worsening lower extremity weakness.  9:51 AM Patient in MRI.  He requires Ativan for anxiety, ordered.  Patient updated on MRI results. Discussed with Dr. Lynelle DoctorKnapp.   I have spoken with on-call neurosurgery, Dr. Jordan LikesPool who will see patient in ED.   1:25 PM Dr. Jordan LikesPool has seen patient. Plan for admit, surgery tomorrow.   BP 114/82   Pulse (!) 51   Temp 99.3 F (37.4 C) (Oral)   Resp 18   Ht 6' 1.5" (1.867 m)   Wt 108.9 kg (240 lb)   SpO2 100%   BMI 31.23 kg/m     Dylan Gill, Dylan Puig, PA-C 12/02/17 1325    Linwood DibblesKnapp, Jon, MD 12/04/17 1144

## 2017-12-02 NOTE — ED Notes (Signed)
[facility] arrives via [facility]

## 2017-12-02 NOTE — ED Provider Notes (Signed)
[facility] of the spine. Patient presented with progressive sensory deficits in his lower extremities over the last week.  States that 2 weeks ago he experience lower back pain and the pain progressed tingling in his lower extremities bilaterally and causing him to lose his footing and fall.  Patient denies any fever or IV drug use.  Patient reports losing bowel function.  MRI with and without contrast of the spine have been ordered.  Patient was comfortable and stable at this time. No midline tenderness to palpation of the spine.  Tender to palpation of the lower back musculature.  Strong dorsalis pedis pulses.  No lower extremity edema.  Decreased sensation to light touch in the left extremity.  Transferred patient care at end of shift pending MRI and disposition.   Georgiana ShoreMitchell, Jerrold Haskell B, PA-C 12/02/17 0703    Dione BoozeGlick, David, MD 12/02/17 [phone]

## 2017-12-02 NOTE — ED Notes (Addendum)
Patient reports tightness on lower back. He states "not really pain, more of a tightness but I'm ok now"

## 2017-12-02 NOTE — H&P (Signed)
Geofrey Silliman is an 37 y.o. male.   Chief Complaint: Weakness HPI: 37 year old male with 2-week history of progressive bilateral lower extremity weakness left greater than right.  Patient also notes numbness and tingling into both hands with some decreased dexterity in both hands.  Patient also with left-sided cervical pain and some lower back pain as well.  Patient with symptoms of urinary frequency and occasional incontinence.  Patient notes difficulty walking secondary to stiffness and poor control in his legs.  He has no history of trauma or injury.  Past Medical History:  Diagnosis Date  . Back pain     History reviewed. No pertinent surgical history.  History reviewed. No pertinent family history. Social History:  reports that he has been smoking cigarettes.  He has been smoking about 1.00 pack per day. He has never used smokeless tobacco. He reports that he drinks alcohol. He reports that he has current or past drug history. Drug: Marijuana.  Allergies: No Known Allergies   (Not in a hospital admission)  Results for orders placed or performed during the hospital encounter of 12/01/17 (from the past 48 hour(s))  Urinalysis, Routine w reflex microscopic     Status: None   Collection Time: 12/01/17  8:55 PM  Result Value Ref Range   Color, Urine YELLOW YELLOW   APPearance CLEAR CLEAR   Specific Gravity, Urine 1.010 1.005 - 1.030   pH 6.5 5.0 - 8.0   Glucose, UA NEGATIVE NEGATIVE mg/dL   Hgb urine dipstick NEGATIVE NEGATIVE   Bilirubin Urine NEGATIVE NEGATIVE   Ketones, ur NEGATIVE NEGATIVE mg/dL   Protein, ur NEGATIVE NEGATIVE mg/dL   Nitrite NEGATIVE NEGATIVE   Leukocytes, UA NEGATIVE NEGATIVE    Comment: Microscopic not done on urines with negative protein, blood, leukocytes, nitrite, or glucose < 500 mg/dL. Performed at [facility], [address] [place], [place] [address]   CBC     Status: Abnormal   Collection Time: 12/01/17 10:24 PM  Result Value Ref  Range   WBC 8.8 4.0 - 10.5 K/uL   RBC 4.09 (L) 4.22 - 5.81 MIL/uL   Hemoglobin 13.3 13.0 - 17.0 g/dL   HCT 39.7 39.0 - 52.0 %   MCV 97.1 78.0 - 100.0 fL   MCH 32.5 26.0 - 34.0 pg   MCHC 33.5 30.0 - 36.0 g/dL   RDW 11.7 11.5 - 15.5 %   Platelets 261 150 - 400 K/uL    Comment: Performed at [facility], [address] [place], [place] [address]  Comprehensive metabolic panel     Status: Abnormal   Collection Time: 12/01/17 10:24 PM  Result Value Ref Range   Sodium 137 135 - 145 mmol/L   Potassium 3.6 3.5 - 5.1 mmol/L   Chloride 106 101 - 111 mmol/L   CO2 23 22 - 32 mmol/L   Glucose, Bld 87 65 - 99 mg/dL   BUN 10 6 - 20 mg/dL   Creatinine, Ser 0.85 0.61 - 1.24 mg/dL   Calcium 9.2 8.9 - 10.3 mg/dL   Total Protein 6.9 6.5 - 8.1 g/dL   Albumin 4.1 3.5 - 5.0 g/dL   AST 15 15 - 41 U/L   ALT 12 (L) 17 - 63 U/L   Alkaline Phosphatase 86 38 - 126 U/L   Total Bilirubin 0.6 0.3 - 1.2 mg/dL   GFR calc non Af Amer >60 >60 mL/min   GFR calc Af Amer >60 >60 mL/min  Comment: (NOTE) The eGFR has been calculated using the CKD EPI equation. This calculation has not been validated in all clinical situations. eGFR's persistently <60 mL/min signify possible Chronic Kidney Disease.    Anion gap 8 5 - 15    Comment: Performed at [facility], [address] [place], [place] [address]  Lipase, blood     Status: None   Collection Time: 12/01/17 10:24 PM  Result Value Ref Range   Lipase 29 11 - 51 U/L    Comment: Performed at [facility], [address], [place], [place] [address]   Mr Cervical Spine W Or Wo Contrast  Result Date: 12/02/2017 CLINICAL DATA:  Progressive sensory deficits in the bilateral lower extremities. EXAM: MRI CERVICAL, THORACIC AND LUMBAR SPINE WITHOUT AND WITH CONTRAST TECHNIQUE: Multiplanar and multiecho pulse sequences of the cervical spine, to include the craniocervical junction and cervicothoracic junction, and thoracic and lumbar  spine, were obtained without and with intravenous contrast. CONTRAST:  59m MULTIHANCE GADOBENATE DIMEGLUMINE 529 MG/ML IV SOLN COMPARISON:  Lumbar spine x-rays dated November 25, 2017. FINDINGS: MRI CERVICAL SPINE FINDINGS Alignment: Slight reversal of the normal cervical lordosis. Sagittal alignment is maintained. Vertebrae: No fracture, evidence of discitis, or bone lesion. Cord: There is faint increased T2 signal within the cord at C4-C5 and C6-C7. No abnormal enhancement. Posterior Fossa, vertebral arteries, paraspinal tissues: Negative. Disc levels: Congenitally short pedicles. C2-C3:  Mild left facet arthropathy.  No stenosis. C3-C4: Broad-based posterior disc bulge and bilateral uncovertebral hypertrophy resulting in moderate central spinal canal stenosis and severe bilateral neuroforaminal stenosis. C4-C5: Large central disc protrusion and bilateral facet uncovertebral hypertrophy resulting in moderate to severe central spinal canal stenosis and severe bilateral neuroforaminal stenosis. C5-C6: Large central disc protrusion and bilateral facet uncovertebral hypertrophy resulting in severe central spinal canal stenosis and severe bilateral neuroforaminal stenosis. C6-C7: Central and left paracentral disc extrusion. Mild bilateral facet uncovertebral hypertrophy. Severe central spinal canal stenosis and moderate bilateral neuroforaminal stenosis. C7-T1: Tiny central disc protrusion. Mild central spinal canal stenosis on the basis of congenitally short pedicles. No neuroforaminal stenosis. MRI THORACIC SPINE FINDINGS Alignment:  Physiologic. Vertebrae: No fracture, evidence of discitis, or bone lesion. Cord:  Normal signal and morphology.  No abnormal enhancement. Paraspinal and other soft tissues: Negative. Disc levels: Mild disc desiccation at T11-T12. No significant degenerative disc disease, spinal canal, or neuroforaminal stenosis at any level. MRI LUMBAR SPINE FINDINGS Segmentation:  Partial sacralization of  L5 on the left. Alignment:  Physiologic. Vertebrae:  No fracture, evidence of discitis, or bone lesion. Conus medullaris and cauda equina: Conus extends to the L1 level. Conus and cauda equina appear normal. No abnormal enhancement. Paraspinal and other soft tissues: Small right renal cyst. Otherwise negative. Disc levels: Congenitally short pedicles. T12-L1:  Negative. L1-L2:  Negative. L2-L3: Tiny disc bulge with focal superimposed central disc protrusion. Mild central spinal canal stenosis. Mild bilateral neuroforaminal stenosis. L3-L4: No significant disc degeneration. No spinal canal stenosis. Mild bilateral neuroforaminal stenosis. L4-L5: Tiny central and left paracentral disc protrusion. Mild bilateral facet arthropathy. Mild left lateral recess stenosis. Moderate bilateral neuroforaminal stenosis. No spinal canal stenosis. L5-S1: Tiny central disc protrusion. Moderate bilateral facet arthropathy. No stenosis. IMPRESSION: Cervical spine: 1. Multilevel degenerative changes of the cervical spine as described above. Disc disease and congenitally short pedicles result in severe spinal canal stenosis at C5-C6 and C6-C7, moderate spinal canal stenosis at C3-C4, and moderate to severe spinal canal stenosis at C4-C5. Severe bilateral neuroforaminal  stenosis from C3-C4 through C5-C6. 2. Faint increased T2 signal within the spinal cord at C4-C5 and C6-C7, suspicious for edema/myelomalacia. No abnormal enhancement. Thoracic spine: 1. No acute abnormality.  No significant degenerative changes. Lumbar spine: 1. Mild degenerative disc disease at L2-L3 in conjunction with congenitally short pedicles results in mild central spinal canal stenosis. Mild bilateral neuroforaminal stenosis at L2-L3 and L3-L4. Moderate bilateral neuroforaminal stenosis at L4-L5. Electronically Signed   By: Titus Dubin M.D.   On: 12/02/2017 11:43   Mr Thoracic Spine W Wo Contrast  Result Date: 12/02/2017 CLINICAL DATA:  Progressive sensory  deficits in the bilateral lower extremities. EXAM: MRI CERVICAL, THORACIC AND LUMBAR SPINE WITHOUT AND WITH CONTRAST TECHNIQUE: Multiplanar and multiecho pulse sequences of the cervical spine, to include the craniocervical junction and cervicothoracic junction, and thoracic and lumbar spine, were obtained without and with intravenous contrast. CONTRAST:  44m MULTIHANCE GADOBENATE DIMEGLUMINE 529 MG/ML IV SOLN COMPARISON:  Lumbar spine x-rays dated November 25, 2017. FINDINGS: MRI CERVICAL SPINE FINDINGS Alignment: Slight reversal of the normal cervical lordosis. Sagittal alignment is maintained. Vertebrae: No fracture, evidence of discitis, or bone lesion. Cord: There is faint increased T2 signal within the cord at C4-C5 and C6-C7. No abnormal enhancement. Posterior Fossa, vertebral arteries, paraspinal tissues: Negative. Disc levels: Congenitally short pedicles. C2-C3:  Mild left facet arthropathy.  No stenosis. C3-C4: Broad-based posterior disc bulge and bilateral uncovertebral hypertrophy resulting in moderate central spinal canal stenosis and severe bilateral neuroforaminal stenosis. C4-C5: Large central disc protrusion and bilateral facet uncovertebral hypertrophy resulting in moderate to severe central spinal canal stenosis and severe bilateral neuroforaminal stenosis. C5-C6: Large central disc protrusion and bilateral facet uncovertebral hypertrophy resulting in severe central spinal canal stenosis and severe bilateral neuroforaminal stenosis. C6-C7: Central and left paracentral disc extrusion. Mild bilateral facet uncovertebral hypertrophy. Severe central spinal canal stenosis and moderate bilateral neuroforaminal stenosis. C7-T1: Tiny central disc protrusion. Mild central spinal canal stenosis on the basis of congenitally short pedicles. No neuroforaminal stenosis. MRI THORACIC SPINE FINDINGS Alignment:  Physiologic. Vertebrae: No fracture, evidence of discitis, or bone lesion. Cord:  Normal signal and  morphology.  No abnormal enhancement. Paraspinal and other soft tissues: Negative. Disc levels: Mild disc desiccation at T11-T12. No significant degenerative disc disease, spinal canal, or neuroforaminal stenosis at any level. MRI LUMBAR SPINE FINDINGS Segmentation:  Partial sacralization of L5 on the left. Alignment:  Physiologic. Vertebrae:  No fracture, evidence of discitis, or bone lesion. Conus medullaris and cauda equina: Conus extends to the L1 level. Conus and cauda equina appear normal. No abnormal enhancement. Paraspinal and other soft tissues: Small right renal cyst. Otherwise negative. Disc levels: Congenitally short pedicles. T12-L1:  Negative. L1-L2:  Negative. L2-L3: Tiny disc bulge with focal superimposed central disc protrusion. Mild central spinal canal stenosis. Mild bilateral neuroforaminal stenosis. L3-L4: No significant disc degeneration. No spinal canal stenosis. Mild bilateral neuroforaminal stenosis. L4-L5: Tiny central and left paracentral disc protrusion. Mild bilateral facet arthropathy. Mild left lateral recess stenosis. Moderate bilateral neuroforaminal stenosis. No spinal canal stenosis. L5-S1: Tiny central disc protrusion. Moderate bilateral facet arthropathy. No stenosis. IMPRESSION: Cervical spine: 1. Multilevel degenerative changes of the cervical spine as described above. Disc disease and congenitally short pedicles result in severe spinal canal stenosis at C5-C6 and C6-C7, moderate spinal canal stenosis at C3-C4, and moderate to severe spinal canal stenosis at C4-C5. Severe bilateral neuroforaminal stenosis from C3-C4 through C5-C6. 2. Faint increased T2 signal within the spinal cord at C4-C5 and C6-C7, suspicious for edema/myelomalacia.  No abnormal enhancement. Thoracic spine: 1. No acute abnormality.  No significant degenerative changes. Lumbar spine: 1. Mild degenerative disc disease at L2-L3 in conjunction with congenitally short pedicles results in mild central spinal canal  stenosis. Mild bilateral neuroforaminal stenosis at L2-L3 and L3-L4. Moderate bilateral neuroforaminal stenosis at L4-L5. Electronically Signed   By: Titus Dubin M.D.   On: 12/02/2017 11:43   Mr Lumbar Spine W Wo Contrast  Result Date: 12/02/2017 CLINICAL DATA:  Progressive sensory deficits in the bilateral lower extremities. EXAM: MRI CERVICAL, THORACIC AND LUMBAR SPINE WITHOUT AND WITH CONTRAST TECHNIQUE: Multiplanar and multiecho pulse sequences of the cervical spine, to include the craniocervical junction and cervicothoracic junction, and thoracic and lumbar spine, were obtained without and with intravenous contrast. CONTRAST:  33m MULTIHANCE GADOBENATE DIMEGLUMINE 529 MG/ML IV SOLN COMPARISON:  Lumbar spine x-rays dated November 25, 2017. FINDINGS: MRI CERVICAL SPINE FINDINGS Alignment: Slight reversal of the normal cervical lordosis. Sagittal alignment is maintained. Vertebrae: No fracture, evidence of discitis, or bone lesion. Cord: There is faint increased T2 signal within the cord at C4-C5 and C6-C7. No abnormal enhancement. Posterior Fossa, vertebral arteries, paraspinal tissues: Negative. Disc levels: Congenitally short pedicles. C2-C3:  Mild left facet arthropathy.  No stenosis. C3-C4: Broad-based posterior disc bulge and bilateral uncovertebral hypertrophy resulting in moderate central spinal canal stenosis and severe bilateral neuroforaminal stenosis. C4-C5: Large central disc protrusion and bilateral facet uncovertebral hypertrophy resulting in moderate to severe central spinal canal stenosis and severe bilateral neuroforaminal stenosis. C5-C6: Large central disc protrusion and bilateral facet uncovertebral hypertrophy resulting in severe central spinal canal stenosis and severe bilateral neuroforaminal stenosis. C6-C7: Central and left paracentral disc extrusion. Mild bilateral facet uncovertebral hypertrophy. Severe central spinal canal stenosis and moderate bilateral neuroforaminal stenosis.  C7-T1: Tiny central disc protrusion. Mild central spinal canal stenosis on the basis of congenitally short pedicles. No neuroforaminal stenosis. MRI THORACIC SPINE FINDINGS Alignment:  Physiologic. Vertebrae: No fracture, evidence of discitis, or bone lesion. Cord:  Normal signal and morphology.  No abnormal enhancement. Paraspinal and other soft tissues: Negative. Disc levels: Mild disc desiccation at T11-T12. No significant degenerative disc disease, spinal canal, or neuroforaminal stenosis at any level. MRI LUMBAR SPINE FINDINGS Segmentation:  Partial sacralization of L5 on the left. Alignment:  Physiologic. Vertebrae:  No fracture, evidence of discitis, or bone lesion. Conus medullaris and cauda equina: Conus extends to the L1 level. Conus and cauda equina appear normal. No abnormal enhancement. Paraspinal and other soft tissues: Small right renal cyst. Otherwise negative. Disc levels: Congenitally short pedicles. T12-L1:  Negative. L1-L2:  Negative. L2-L3: Tiny disc bulge with focal superimposed central disc protrusion. Mild central spinal canal stenosis. Mild bilateral neuroforaminal stenosis. L3-L4: No significant disc degeneration. No spinal canal stenosis. Mild bilateral neuroforaminal stenosis. L4-L5: Tiny central and left paracentral disc protrusion. Mild bilateral facet arthropathy. Mild left lateral recess stenosis. Moderate bilateral neuroforaminal stenosis. No spinal canal stenosis. L5-S1: Tiny central disc protrusion. Moderate bilateral facet arthropathy. No stenosis. IMPRESSION: Cervical spine: 1. Multilevel degenerative changes of the cervical spine as described above. Disc disease and congenitally short pedicles result in severe spinal canal stenosis at C5-C6 and C6-C7, moderate spinal canal stenosis at C3-C4, and moderate to severe spinal canal stenosis at C4-C5. Severe bilateral neuroforaminal stenosis from C3-C4 through C5-C6. 2. Faint increased T2 signal within the spinal cord at C4-C5 and  C6-C7, suspicious for edema/myelomalacia. No abnormal enhancement. Thoracic spine: 1. No acute abnormality.  No significant degenerative changes. Lumbar spine: 1. Mild degenerative disc  disease at L2-L3 in conjunction with congenitally short pedicles results in mild central spinal canal stenosis. Mild bilateral neuroforaminal stenosis at L2-L3 and L3-L4. Moderate bilateral neuroforaminal stenosis at L4-L5. Electronically Signed   By: Titus Dubin M.D.   On: 12/02/2017 11:43    Pertinent items noted in HPI and remainder of comprehensive ROS otherwise negative.  Blood pressure 114/82, pulse (!) 51, temperature 99.3 F (37.4 C), temperature source Oral, resp. rate 18, height 6' 1.5" (1.867 m), weight 108.9 kg (240 lb), SpO2 100 %.  The patient is awake and alert.  He is oriented and appropriate.  His speech is fluent.  His judgment and insight appear intact.  Cranial nerve function normal bilaterally.  Motor examination of his upper extremities reveals weakness of grips and intrinsics function in both hands grading out at 4/5.  He has weakness involving both lower extremities which is spastic and grades out at 4+/5 with 4-/5 left dorsiflexion weakness.  Reflexes are increased in both upper and lower extremities.  Hoffmann's response positive in his left hand.  Clonus in the left ankle.  Examination head ears eyes nose throat is unremarkable.  Chest and abdomen are benign.  Extremities are free from injury deformity. 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  I discussed situation with the patient.  I recommended that we move forward with surgical decompression and fusion surgery by means of a C4-5, C5-6 and C6-7 anterior cervical discectomy and interbody fusion utilizing interbody peek cages, local harvested autograft, and anterior plate instrumentation.  I discussed the risks involved with  surgery including but not limited to the risk of anesthesia, bleeding, infection, CSF leak, nerve root injury, spinal cord injury, fusion failure, instrumentation failure, dysphagia, dysphonia, continued pain, and non-benefit.  Patient has been given the opportunity to ask questions.  He appears to understand.  He wishes to proceed with surgery.  Plan is for admission to the hospital with institution of IV steroid treatment with surgery in the morning.  Mallie Mussel A Undra Harriman 12/02/2017, 1:46 PM

## 2017-12-02 NOTE — ED Notes (Signed)
Spoke to MRI staff=> they have sent transport staff to pick him up.   Patient denies claustrophobia and ready to go

## 2017-12-02 NOTE — ED Notes (Signed)
MRI unsure when pt will be able to have imaging completed; states his exam will be a 3 hour study; pt informed of delay

## 2017-12-02 NOTE — ED Notes (Signed)
Regular dinner tray ordered 

## 2017-12-02 NOTE — Progress Notes (Signed)
Pt informed RN d/t hospital restriction for children, he wanted to be able to go off unit to visit with family in car outside when they come visit him. MD notified and new order received for pt to go off unit. RN asked pt if he's a smoker and he said "Yes". Report he smoke 1 pack a day; RN offered to get nicotine patch order for him if needed but pt declined. Pt educated and informed the campus is smoke free and he's not allowed to smoke when he goes off unit. Pt notified staff with accompany him whenever he goes off unit. Pt voices understanding and in bed resting comfortably with call light within reach. Will closely monitor pt. Dionne BucyP. Amo Lorah Kalina RN

## 2017-12-02 NOTE — Anesthesia Preprocedure Evaluation (Addendum)
Anesthesia Evaluation  Patient identified by MRN, date of birth, ID band Patient awake    Reviewed: Allergy & Precautions, NPO status , Patient's Chart, lab work & pertinent test results  Airway Mallampati: I       Dental no notable dental hx. (+) Teeth Intact   Pulmonary Current Smoker,    Pulmonary exam normal breath sounds clear to auscultation       Cardiovascular negative cardio ROS Normal cardiovascular exam Rhythm:Regular Rate:Normal     Neuro/Psych negative psych ROS   GI/Hepatic negative GI ROS, Neg liver ROS,   Endo/Other  negative endocrine ROS  Renal/GU negative Renal ROS  negative genitourinary   Musculoskeletal negative musculoskeletal ROS (+)   Abdominal Normal abdominal exam  (+) + obese,   Peds  Hematology negative hematology ROS (+)   Anesthesia Other Findings   Reproductive/Obstetrics                            Anesthesia Physical Anesthesia Plan  ASA: II  Anesthesia Plan: General   Post-op Pain Management:    Induction: Intravenous  PONV Risk Score and Plan: 2 and Ondansetron and Dexamethasone  Airway Management Planned: Oral ETT  Additional Equipment:   Intra-op Plan:   Post-operative Plan: Extubation in OR  Informed Consent: I have reviewed the patients History and Physical, chart, labs and discussed the procedure including the risks, benefits and alternatives for the proposed anesthesia with the patient or authorized representative who has indicated his/her understanding and acceptance.   Dental advisory given  Plan Discussed with: CRNA and Surgeon  Anesthesia Plan Comments:        Anesthesia Quick Evaluation

## 2017-12-02 NOTE — ED Notes (Signed)
Patient is back from MRI  

## 2017-12-02 NOTE — ED Notes (Signed)
Neurosurgery MD at bedside.

## 2017-12-02 NOTE — ED Notes (Signed)
Pt given ginger ale and a pack of crackers

## 2017-12-03 ENCOUNTER — Encounter (HOSPITAL_COMMUNITY)
Admission: EM | Disposition: A | Discharge status: Home / Self Care | Payer: Self-pay | Source: Home / Self Care | Attending: Neurosurgery

## 2017-12-03 ENCOUNTER — Encounter (HOSPITAL_COMMUNITY): Payer: Self-pay | Admitting: Certified Registered Nurse Anesthetist

## 2017-12-03 ENCOUNTER — Inpatient Hospital Stay (HOSPITAL_COMMUNITY): Payer: Medicaid Other | Admitting: Anesthesiology

## 2017-12-03 ENCOUNTER — Inpatient Hospital Stay (HOSPITAL_COMMUNITY): Admission: RE | Admit: 2017-12-03 | Payer: Medicaid Other | Source: Ambulatory Visit | Admitting: Neurosurgery

## 2017-12-03 ENCOUNTER — Inpatient Hospital Stay (HOSPITAL_COMMUNITY): Payer: Medicaid Other

## 2017-12-03 HISTORY — PX: ANTERIOR CERVICAL DECOMP/DISCECTOMY FUSION: SHX1161

## 2017-12-03 LAB — HIV ANTIBODY (ROUTINE TESTING W REFLEX): HIV SCREEN 4TH GENERATION: NONREACTIVE

## 2017-12-03 LAB — MRSA PCR SCREENING: MRSA by PCR: NEGATIVE

## 2017-12-03 SURGERY — ANTERIOR CERVICAL DECOMPRESSION/DISCECTOMY FUSION 3 LEVELS
Anesthesia: General

## 2017-12-03 MED ORDER — HYDROMORPHONE HCL 1 MG/ML IJ SOLN: 1.0000 mg | INTRAMUSCULAR | Status: DC | PRN

## 2017-12-03 MED ORDER — 0.9 % SODIUM CHLORIDE (POUR BTL) OPTIME
TOPICAL | Status: DC | PRN
  Administered 2017-12-03: 1000 mL

## 2017-12-03 MED ORDER — THROMBIN 5000 UNITS EX SOLR
CUTANEOUS | Status: AC
  Filled 2017-12-03: qty 5000

## 2017-12-03 MED ORDER — DEXAMETHASONE SODIUM PHOSPHATE 4 MG/ML IJ SOLN
INTRAMUSCULAR | Status: DC | PRN
  Administered 2017-12-03: 10 mg via INTRAVENOUS

## 2017-12-03 MED ORDER — SODIUM CHLORIDE 0.9 % IV SOLN: 250.0000 mL | INTRAVENOUS | Status: DC

## 2017-12-03 MED ORDER — FENTANYL CITRATE (PF) 100 MCG/2ML IJ SOLN
INTRAMUSCULAR | Status: DC | PRN
  Administered 2017-12-03 (×2): 50 ug via INTRAVENOUS
  Administered 2017-12-03 (×2): 100 ug via INTRAVENOUS
  Administered 2017-12-03 (×2): 50 ug via INTRAVENOUS

## 2017-12-03 MED ORDER — THROMBIN 20000 UNITS EX SOLR
CUTANEOUS | Status: AC
  Filled 2017-12-03: qty 20000

## 2017-12-03 MED ORDER — HYDROMORPHONE HCL 1 MG/ML IJ SOLN
0.2500 mg | INTRAMUSCULAR | Status: DC | PRN
  Administered 2017-12-03 (×4): 0.5 mg via INTRAVENOUS

## 2017-12-03 MED ORDER — PHENOL 1.4 % MT LIQD: 1.0000 | OROMUCOSAL | Status: DC | PRN

## 2017-12-03 MED ORDER — THROMBIN (RECOMBINANT) 5000 UNITS EX SOLR
OROMUCOSAL | Status: DC | PRN
  Administered 2017-12-03: 09:00:00 via TOPICAL

## 2017-12-03 MED ORDER — ACETAMINOPHEN 650 MG RE SUPP: 650.0000 mg | RECTAL | Status: DC | PRN

## 2017-12-03 MED ORDER — GLYCOPYRROLATE 0.2 MG/ML IJ SOLN
INTRAMUSCULAR | Status: DC | PRN
  Administered 2017-12-03 (×2): 0.2 mg via INTRAVENOUS

## 2017-12-03 MED ORDER — HYDROMORPHONE HCL 1 MG/ML IJ SOLN
INTRAMUSCULAR | Status: AC
  Filled 2017-12-03: qty 1

## 2017-12-03 MED ORDER — ROCURONIUM BROMIDE 100 MG/10ML IV SOLN
INTRAVENOUS | Status: DC | PRN
  Administered 2017-12-03: 60 mg via INTRAVENOUS

## 2017-12-03 MED ORDER — MEPERIDINE HCL 50 MG/ML IJ SOLN: 6.2500 mg | INTRAMUSCULAR | Status: DC | PRN

## 2017-12-03 MED ORDER — ONDANSETRON HCL 4 MG PO TABS: 4.0000 mg | ORAL_TABLET | Freq: Four times a day (QID) | ORAL | Status: DC | PRN

## 2017-12-03 MED ORDER — KETOROLAC TROMETHAMINE 30 MG/ML IJ SOLN
INTRAMUSCULAR | Status: AC
  Filled 2017-12-03: qty 1

## 2017-12-03 MED ORDER — KETOROLAC TROMETHAMINE 30 MG/ML IJ SOLN
30.0000 mg | Freq: Once | INTRAMUSCULAR | Status: DC | PRN
  Administered 2017-12-03: 30 mg via INTRAVENOUS

## 2017-12-03 MED ORDER — LIDOCAINE HCL (CARDIAC) 20 MG/ML IV SOLN
INTRAVENOUS | Status: DC | PRN
  Administered 2017-12-03: 100 mg via INTRAVENOUS

## 2017-12-03 MED ORDER — ACETAMINOPHEN 325 MG PO TABS: 650.0000 mg | ORAL_TABLET | ORAL | Status: DC | PRN

## 2017-12-03 MED ORDER — THROMBIN (RECOMBINANT) 20000 UNITS EX SOLR
CUTANEOUS | Status: DC | PRN
  Administered 2017-12-03: 08:00:00 via TOPICAL

## 2017-12-03 MED ORDER — PROPOFOL 10 MG/ML IV BOLUS
INTRAVENOUS | Status: DC | PRN
  Administered 2017-12-03: 200 mg via INTRAVENOUS

## 2017-12-03 MED ORDER — ONDANSETRON HCL 4 MG/2ML IJ SOLN
4.0000 mg | Freq: Four times a day (QID) | INTRAMUSCULAR | Status: DC | PRN
  Administered 2017-12-03: 4 mg via INTRAVENOUS
  Filled 2017-12-03: qty 2

## 2017-12-03 MED ORDER — PHENYLEPHRINE HCL 10 MG/ML IJ SOLN
INTRAMUSCULAR | Status: DC | PRN
  Administered 2017-12-03 (×4): 80 ug via INTRAVENOUS

## 2017-12-03 MED ORDER — CYCLOBENZAPRINE HCL 10 MG PO TABS: 10.0000 mg | ORAL_TABLET | Freq: Three times a day (TID) | ORAL | Status: DC | PRN

## 2017-12-03 MED ORDER — CEFAZOLIN SODIUM-DEXTROSE 1-4 GM/50ML-% IV SOLN
1.0000 g | Freq: Three times a day (TID) | INTRAVENOUS | Status: AC
  Administered 2017-12-03 (×2): 1 g via INTRAVENOUS
  Filled 2017-12-03 (×2): qty 50

## 2017-12-03 MED ORDER — LACTATED RINGERS IV SOLN
INTRAVENOUS | Status: DC | PRN
  Administered 2017-12-03 (×2): via INTRAVENOUS

## 2017-12-03 MED ORDER — SODIUM CHLORIDE 0.9% FLUSH: 3.0000 mL | INTRAVENOUS | Status: DC | PRN

## 2017-12-03 MED ORDER — SODIUM CHLORIDE 0.9% FLUSH
3.0000 mL | Freq: Two times a day (BID) | INTRAVENOUS | Status: DC
  Administered 2017-12-03 – 2017-12-04 (×3): 3 mL via INTRAVENOUS

## 2017-12-03 MED ORDER — PROMETHAZINE HCL 25 MG/ML IJ SOLN: 6.2500 mg | INTRAMUSCULAR | Status: DC | PRN

## 2017-12-03 MED ORDER — MENTHOL 3 MG MT LOZG: 1.0000 | LOZENGE | OROMUCOSAL | Status: DC | PRN

## 2017-12-03 MED ORDER — MIDAZOLAM HCL 5 MG/5ML IJ SOLN
INTRAMUSCULAR | Status: DC | PRN
  Administered 2017-12-03: 2 mg via INTRAVENOUS

## 2017-12-03 MED ORDER — BACITRACIN 50000 UNITS IM SOLR
INTRAMUSCULAR | Status: DC | PRN
  Administered 2017-12-03: 08:00:00

## 2017-12-03 MED ORDER — SUGAMMADEX SODIUM 200 MG/2ML IV SOLN
INTRAVENOUS | Status: DC | PRN
  Administered 2017-12-03: 300 mg via INTRAVENOUS

## 2017-12-03 MED ORDER — HYDROCODONE-ACETAMINOPHEN 10-325 MG PO TABS: 2.0000 | ORAL_TABLET | ORAL | Status: DC | PRN

## 2017-12-03 MED ORDER — HYDROCODONE-ACETAMINOPHEN 5-325 MG PO TABS
1.0000 | ORAL_TABLET | ORAL | Status: DC | PRN
  Administered 2017-12-04: 1 via ORAL
  Filled 2017-12-03: qty 1

## 2017-12-03 SURGICAL SUPPLY — 58 items
BAG DECANTER FOR FLEXI CONT (MISCELLANEOUS) ×3 IMPLANT
BENZOIN TINCTURE PRP APPL 2/3 (GAUZE/BANDAGES/DRESSINGS) ×3 IMPLANT
BIT DRILL 13 (BIT) ×2 IMPLANT
BIT DRILL 13MM (BIT) ×1
BUR MATCHSTICK NEURO 3.0 LAGG (BURR) ×3 IMPLANT
CAGE PEEK 6X14X11 (Cage) ×2 IMPLANT
CAGE PEEK 7X14X11 (Cage) ×4 IMPLANT
CANISTER SUCT 3000ML PPV (MISCELLANEOUS) ×3 IMPLANT
CARTRIDGE OIL MAESTRO DRILL (MISCELLANEOUS) ×1 IMPLANT
CLOSURE WOUND 1/2 X4 (GAUZE/BANDAGES/DRESSINGS) ×1
DIFFUSER DRILL AIR PNEUMATIC (MISCELLANEOUS) ×3 IMPLANT
DRAPE C-ARM 42X72 X-RAY (DRAPES) ×6 IMPLANT
DRAPE LAPAROTOMY 100X72 PEDS (DRAPES) ×3 IMPLANT
DRAPE MICROSCOPE LEICA (MISCELLANEOUS) ×3 IMPLANT
DURAPREP 6ML APPLICATOR 50/CS (WOUND CARE) ×3 IMPLANT
ELECT COATED BLADE 2.86 ST (ELECTRODE) ×3 IMPLANT
ELECT REM PT RETURN 9FT ADLT (ELECTROSURGICAL) ×3
ELECTRODE REM PT RTRN 9FT ADLT (ELECTROSURGICAL) ×1 IMPLANT
GAUZE SPONGE 4X4 12PLY STRL (GAUZE/BANDAGES/DRESSINGS) ×3 IMPLANT
GAUZE SPONGE 4X4 12PLY STRL LF (GAUZE/BANDAGES/DRESSINGS) ×3 IMPLANT
GAUZE SPONGE 4X4 16PLY XRAY LF (GAUZE/BANDAGES/DRESSINGS) IMPLANT
GLOVE ECLIPSE 9.0 STRL (GLOVE) ×3 IMPLANT
GLOVE EXAM NITRILE LRG STRL (GLOVE) IMPLANT
GLOVE EXAM NITRILE XL STR (GLOVE) IMPLANT
GLOVE EXAM NITRILE XS STR PU (GLOVE) IMPLANT
GOWN STRL REUS W/ TWL LRG LVL3 (GOWN DISPOSABLE) IMPLANT
GOWN STRL REUS W/ TWL XL LVL3 (GOWN DISPOSABLE) IMPLANT
GOWN STRL REUS W/TWL 2XL LVL3 (GOWN DISPOSABLE) IMPLANT
GOWN STRL REUS W/TWL LRG LVL3 (GOWN DISPOSABLE)
GOWN STRL REUS W/TWL XL LVL3 (GOWN DISPOSABLE)
HALTER HD/CHIN CERV TRACTION D (MISCELLANEOUS) ×3 IMPLANT
HEMOSTAT POWDER KIT SURGIFOAM (HEMOSTASIS) ×3 IMPLANT
KIT BASIN OR (CUSTOM PROCEDURE TRAY) ×3 IMPLANT
KIT TURNOVER KIT B (KITS) ×3 IMPLANT
NEEDLE SPNL 20GX3.5 QUINCKE YW (NEEDLE) ×3 IMPLANT
NS IRRIG 1000ML POUR BTL (IV SOLUTION) ×3 IMPLANT
OIL CARTRIDGE MAESTRO DRILL (MISCELLANEOUS) ×3
PACK LAMINECTOMY NEURO (CUSTOM PROCEDURE TRAY) ×3 IMPLANT
PAD ARMBOARD 7.5X6 YLW CONV (MISCELLANEOUS) ×9 IMPLANT
PLATE 3 65XLCK NS SPNE CVD (Plate) ×1 IMPLANT
PLATE 3 ATLANTIS TRANS (Plate) ×2 IMPLANT
RUBBERBAND STERILE (MISCELLANEOUS) ×6 IMPLANT
SCREW ST FIX 4 ATL 3120213 (Screw) ×24 IMPLANT
SPACER SPNL 11X14X6XPEEK CVD (Cage) ×1 IMPLANT
SPACER SPNL 11X14X7XPEEK CVD (Cage) ×2 IMPLANT
SPCR SPNL 11X14X6XPEEK CVD (Cage) ×1 IMPLANT
SPCR SPNL 11X14X7XPEEK CVD (Cage) ×2 IMPLANT
SPONGE INTESTINAL PEANUT (DISPOSABLE) ×3 IMPLANT
SPONGE SURGIFOAM ABS GEL 100 (HEMOSTASIS) ×3 IMPLANT
STRIP CLOSURE SKIN 1/2X4 (GAUZE/BANDAGES/DRESSINGS) ×2 IMPLANT
SUT VIC AB 3-0 SH 8-18 (SUTURE) ×3 IMPLANT
SUT VIC AB 4-0 RB1 18 (SUTURE) ×3 IMPLANT
TAPE CLOTH 4X10 WHT NS (GAUZE/BANDAGES/DRESSINGS) ×3 IMPLANT
TAPE CLOTH SURG 4X10 WHT LF (GAUZE/BANDAGES/DRESSINGS) ×3 IMPLANT
TOWEL GREEN STERILE (TOWEL DISPOSABLE) ×3 IMPLANT
TOWEL GREEN STERILE FF (TOWEL DISPOSABLE) ×3 IMPLANT
TRAP SPECIMEN MUCOUS 40CC (MISCELLANEOUS) ×3 IMPLANT
WATER STERILE IRR 1000ML POUR (IV SOLUTION) ×3 IMPLANT

## 2017-12-03 NOTE — Evaluation (Signed)
Physical Therapy Evaluation Patient Details Name: Dylan Gill MRN: [id number] DOB: 06/03/1981 Today's Date: 12/03/2017   History of Present Illness  Pt. is a 37 y.o. M with no significant PMH on file with 2 week history of progressive bilateral lower extremity weakness, numbness/tingling, left sided cervical pain, lower back pain, and symptoms of urinary frequency and occasional incontinence. Found to have marked cervical stenosis with spinal cord signal abnormality at C4-5, C5-6 and C6-7 s/p ACDF C4-7.   Clinical Impression  Patient is s/p above surgery resulting in functional limitations due to the deficits listed below (see PT Problem List). At the time of PT evaluation, patient was very limited by dizziness and nausea. BP 107/61. Patient demonstrates good pain control and strength with manual muscle testing and no significant differences noted between right and left lower extremities. However, patient with poor balance during ambulation. Suspect patient will progress well due to age and motivation. Patient will benefit from skilled PT to increase their independence and safety with mobility to allow discharge to the venue listed below.       Follow Up Recommendations Home health PT;Supervision for mobility/OOB    Equipment Recommendations  3in1 (PT)    Recommendations for Other Services       Precautions / Restrictions Precautions Precautions: Cervical Precaution Booklet Issued: Yes (comment) Restrictions Weight Bearing Restrictions: No      Mobility  Bed Mobility Overal bed mobility: Needs Assistance Bed Mobility: Rolling;Sidelying to Sit;Sit to Sidelying Rolling: Modified independent (Device/Increase time) Sidelying to sit: Modified independent (Device/Increase time)     Sit to sidelying: Supervision General bed mobility comments: Instructed on log roll technique during supine to sit. No carryover with sit to supine.   Transfers Overall transfer level: Needs  assistance Equipment used: None Transfers: Sit to/from Stand Sit to Stand: Min guard         General transfer comment: VC's for scooting forward to EOB and narrower BOS.   Ambulation/Gait Ambulation/Gait assistance: Min assist Ambulation Distance (Feet): 15 Feet Assistive device: None(IV pole) Gait Pattern/deviations: Wide base of support   Gait velocity interpretation: Below normal speed for age/gender General Gait Details: Patient ambulated in room with use of IV pole and "furniture walking" with other hand with min assist. Very unsteady with wide BOS and decreased foot clearance.   Stairs            Wheelchair Mobility    Modified Rankin (Stroke Patients Only)       Balance Overall balance assessment: Needs assistance Sitting-balance support: No upper extremity supported;Feet unsupported Sitting balance-Leahy Scale: Good     Standing balance support: Single extremity supported;During functional activity Standing balance-Leahy Scale: Poor Standing balance comment: Patient requiring use of IV pole to steady                             Pertinent Vitals/Pain Pain Assessment: Faces Faces Pain Scale: Hurts a little bit Pain Location: Anterior neck Pain Descriptors / Indicators: Discomfort Pain Intervention(s): Monitored during session    Home Living Family/patient expects to be discharged to:: Private residence Living Arrangements: Spouse/significant other;Non-relatives/Friends Available Help at Discharge: Family Type of Home: House Home Access: Level entry     Home Layout: One level Home Equipment: Gilmer Mor - single point Additional Comments: Patient states he lives with mother in law, 2 sons, 2 daughters, fiance, and brother in Social worker.     Prior Function Level of Independence: Independent with assistive device(s)  Comments: Patient reports he has been using a SPC for ambulation. Independent with bathing. Unable to cook. Patient's son  assists him with donning his pants.       Hand Dominance        Extremity/Trunk Assessment   Upper Extremity Assessment Upper Extremity Assessment: Defer to OT evaluation    Lower Extremity Assessment Lower Extremity Assessment: LLE deficits/detail;RLE deficits/detail RLE Deficits / Details: MMT: hip flexion 5/5, knee extension 5/5, knee flexion 5/5, ankle dorsiflexion 5/5. Patient reporting numbness medial knee.  LLE Deficits / Details: MMT: hip flexion 5/5, knee extension 5/5, knee flexion 4/5, ankle dorsiflexion 5/5. Patient reporting numbness medial knee.        Communication   Communication: No difficulties  Cognition Arousal/Alertness: Suspect due to medications Behavior During Therapy: WFL for tasks assessed/performed Overall Cognitive Status: Within Functional Limits for tasks assessed                                        General Comments      Exercises     Assessment/Plan    PT Assessment Patient needs continued PT services  PT Problem List Decreased activity tolerance;Decreased mobility;Decreased balance;Decreased strength;Decreased knowledge of precautions;Impaired sensation       PT Treatment Interventions DME instruction;Stair training;Gait training;Functional mobility training;Therapeutic activities;Therapeutic exercise;Balance training;Patient/family education    PT Goals (Current goals can be found in the Care Plan section)  Acute Rehab PT Goals Patient Stated Goal: Improved balance PT Goal Formulation: With patient Time For Goal Achievement: 12/17/17 Potential to Achieve Goals: Good    Frequency Min 5X/week   Barriers to discharge        Co-evaluation               AM-PAC PT "6 Clicks" Daily Activity  Outcome Measure Difficulty turning over in bed (including adjusting bedclothes, sheets and blankets)?: None Difficulty moving from lying on back to sitting on the side of the bed? : A Little Difficulty sitting down on  and standing up from a chair with arms (e.g., wheelchair, bedside commode, etc,.)?: A Little Help needed moving to and from a bed to chair (including a wheelchair)?: A Little Help needed walking in hospital room?: A Little Help needed climbing 3-5 steps with a railing? : A Lot 6 Click Score: 18    End of Session Equipment Utilized During Treatment: Gait belt Activity Tolerance: Other (comment)(Limited by dizziness and nausea) Patient left: in bed;with call bell/phone within reach Nurse Communication: Other (comment);Mobility status(Patient nausea) PT Visit Diagnosis: Unsteadiness on feet (R26.81);Difficulty in walking, not elsewhere classified (R26.2)    Time: 1191-47821341-1413 PT Time Calculation (min) (ACUTE ONLY): 32 min   Charges:   PT Evaluation $PT Eval Moderate Complexity: 1 Mod PT Treatments $Therapeutic Activity: 8-22 mins   PT G Codes:        Laurina Bustlearoline Khadeeja Elden, PT, DPT Acute Rehabilitation Services  Pager: [phone]   Vanetta MuldersCarloine H Ygnacio Fecteau 12/03/2017, 3:13 PM

## 2017-12-03 NOTE — Progress Notes (Signed)
Report given to caroline rn as caregiver 

## 2017-12-03 NOTE — Anesthesia Postprocedure Evaluation (Signed)
Anesthesia Post Note  Patient: Dylan CrazeBradley Hartman  Procedure(s) Performed: CERVICAL FOUR- FIVE, CERVICAL FIVE- SIX, CERVICAL SIX- SEVEN ANTERIOR CERVICAL DECOMPRESSION/DISCECTOMY FUSION (N/A )     Anesthesia Post Evaluation  Last Vitals:  Vitals:   12/03/17 1132 12/03/17 1211  BP: 119/75 (!) 100/57  Pulse: (!) 59 64  Resp: 12 14  Temp: (!) 36.4 C 36.4 C  SpO2: 96% 100%    Last Pain:  Vitals:   12/03/17 1211  TempSrc: Oral  PainSc:    Pain Goal: Patients Stated Pain Goal: 0 (12/03/17 0020)               Kara Mierzejewski JR,JOHN Susann GivensFRANKLIN

## 2017-12-03 NOTE — Transfer of Care (Signed)
Immediate Anesthesia Transfer of Care Note  Patient: Dylan CrazeBradley Gill  Procedure(s) Performed: CERVICAL FOUR- FIVE, CERVICAL FIVE- SIX, CERVICAL SIX- SEVEN ANTERIOR CERVICAL DECOMPRESSION/DISCECTOMY FUSION (N/A )  Patient Location: PACU  Anesthesia Type:General  Level of Consciousness: awake, alert  and oriented  Airway & Oxygen Therapy: Patient Spontanous Breathing and Patient connected to nasal cannula oxygen  Post-op Assessment: Report given to RN, Post -op Vital signs reviewed and stable and Patient moving all extremities  Post vital signs: Reviewed and stable  Last Vitals:  Vitals Value Taken Time  BP    Temp    Pulse    Resp    SpO2      Last Pain:  Vitals:   12/03/17 0409  TempSrc: Oral  PainSc: 0-No pain      Patients Stated Pain Goal: 0 (12/03/17 0020)  Complications: No apparent anesthesia complications

## 2017-12-03 NOTE — Progress Notes (Signed)
Orthopedic Tech Progress Note Patient Details:  Dylan CrazeBradley Gill Apr 13, 1981 [id number]  Ortho Devices Type of Ortho Device: Soft collar Ortho Device/Splint Location: neck Ortho Device/Splint Interventions: Application   Post Interventions Patient Tolerated: Well Instructions Provided: Care of device   Nikki DomCrawford, [name] 12/03/2017, 11:13 AM

## 2017-12-03 NOTE — Brief Op Note (Signed)
12/01/2017 - 12/03/2017  10:19 AM  PATIENT:  Dylan Gill  37 y.o. male  PRE-OPERATIVE DIAGNOSIS:  Stenosis  POST-OPERATIVE DIAGNOSIS:  Stenosis  PROCEDURE:  Procedure(s): CERVICAL FOUR- FIVE, CERVICAL FIVE- SIX, CERVICAL SIX- SEVEN ANTERIOR CERVICAL DECOMPRESSION/DISCECTOMY FUSION (N/A)  SURGEON:  Surgeon(s) and Role:    * Julio SicksPool, Rashea Hoskie, MD - Primary  PHYSICIAN ASSISTANT:   ASSISTANTS:    ANESTHESIA:   general  EBL:  Minimal   BLOOD ADMINISTERED:none  DRAINS: none   LOCAL MEDICATIONS USED:  NONE  SPECIMEN:  No Specimen  DISPOSITION OF SPECIMEN:  N/A  COUNTS:  YES  TOURNIQUET:  * No tourniquets in log *  DICTATION: .Dragon Dictation  PLAN OF CARE: Admit to inpatient   PATIENT DISPOSITION:  PACU - hemodynamically stable.   Delay start of Pharmacological VTE agent (>24hrs) due to surgical blood loss or risk of bleeding: yes

## 2017-12-03 NOTE — Progress Notes (Signed)
Patient transported to surgery. 

## 2017-12-03 NOTE — Op Note (Signed)
Date of procedure: 12/03/2017  Date of dictation: Same  Service: Neurosurgery  Preoperative diagnosis: Cervical stenosis with myelopathy  Postoperative diagnosis: Same  Procedure Name: C4-5, C5-6, C6-7 anterior cervical discectomy with interbody fusion utilizing interbody peek cages, locally harvested autograft, and anterior plate instrumentation  Surgeon:Madgeline Rayo A.Novia Lansberry, M.D.  Asst. Surgeon: None  Anesthesia: General  Indication: 37 year old male with progressive bilateral lower extremity weakness and spasticity with bowel and bladder change and symptoms of numbness and paresthesias extending into both distal upper extremities.  Patient found to have marked cervical stenosis with spinal cord signal abnormality at C4-5, C5-6 and C6-7.  Patient presents now for 3 level decompressive surgery with fusion in hopes of improving his symptoms.Marland Kitchen.  Operative note: After induction of anesthesia, patient position supine with neck slightly extended and held in place with holder traction.  Patient's anterior cervical region prepped and draped sterilely.  Incision made overlying C5-6.  Dissection performed on the right.  Retractor placed.  Fluoroscopy used.  Levels confirmed.  Disc spaces incised at C4-C5, C5-6, C6-7.  Discectomy performed at all 3 levels using various instruments down to over the posterior annulus.  Microscope was then brought to the field used throughout the remainder of the discectomy.  Remaining aspects of annulus and osteophytes were removed using high-speed drill down to level the posterior logical ligament.  Posterior logical visit elevated and resected piecemeal fashion.  Underlying thecal sac was identified.  Wide central decompression was then performed undercutting the bodies of C4 and C5.  Decompression then proceeded into each neural foramen.  Wide anterior foraminotomies performed on the course exiting C5 nerve roots bilaterally.  At this point a very thorough decompression been  achieved.  The procedure was then repeated at C5-6 and C6-7 again without complications.  Wound was then irrigated fanlike solution.  Gelfoam was placed topically then removed.  [employer] anatomic peek cages were then packed with locally harvested autograft and then impacted into place at all 3 levels.  Each cage was recessed slightly from the anterior cortical margin.  Atlantis translational anterior cervical plate was then placed over the C4, C5, C6, and C7 levels.  This and attached under fluoroscopic guidance using 13 mm fixed angle screws to each at all 4 levels.  All screws given final tightening found to be solid within bone.  Locking screws engaged all levels.  Final images reveal good position of the cages and the hardware at the proper operative level with normal alignment spine.  Wound was then irrigated one final time.  Hemostasis was assured with bipolar cautery.  Wounds and closed in layers with Vicryl sutures.  Steri-Strips and sterile dressing were applied.  No apparent complications.  Patient tolerated the procedure well and he returns to the recovery room postop.

## 2017-12-03 NOTE — Anesthesia Procedure Notes (Signed)
Procedure Name: Intubation Date/Time: 12/03/2017 7:50 AM Performed by: Emillie Chasen T, CRNA Pre-anesthesia Checklist: Patient identified, Emergency Drugs available, Suction available and Patient being monitored Patient Re-evaluated:Patient Re-evaluated prior to induction Oxygen Delivery Method: Circle system utilized Preoxygenation: Pre-oxygenation with 100% oxygen Induction Type: IV induction Ventilation: Mask ventilation with difficulty and Oral airway inserted - appropriate to patient size Laryngoscope Size: Glidescope and 4 Grade View: Grade I Tube type: Oral Tube size: 7.5 mm Number of attempts: 1 Airway Equipment and Method: Patient positioned with wedge pillow,  Stylet and Video-laryngoscopy Placement Confirmation: ETT inserted through vocal cords under direct vision,  positive ETCO2 and breath sounds checked- equal and bilateral Secured at: 23 cm Tube secured with: Tape Dental Injury: Teeth and Oropharynx as per pre-operative assessment

## 2017-12-03 NOTE — Plan of Care (Signed)
progressing 

## 2017-12-03 NOTE — Progress Notes (Signed)
Occupational Therapy Evaluation Patient Details Name: Dylan Gill MRN: [id number] DOB: Feb 03, 1981 Today's Date: 12/03/2017    History of Present Illness Pt. is a 37 y.o. M with no significant PMH on file with 2 week history of progressive bilateral lower extremity weakness, numbness/tingling, left sided cervical pain, lower back pain, and symptoms of urinary frequency and occasional incontinence. Found to have marked cervical stenosis with spinal cord signal abnormality at C4-5, C5-6 and C6-7 s/p ACDF C4-7 12/03/17.    Clinical Impression   PTA, pt independent with ADL and mobility until @ 3-4 weeks ago where he required A for LB ADL and experienced multiple falls. Pt states his strength and sensation has improved since surgery. Pt overall min A with mobility @ RW level and mod A with LB ADL. Will follow acutely to complete education to facilitate safe DC home.     Follow Up Recommendations  No OT follow up;Supervision - Intermittent    Equipment Recommendations  3 in 1 bedside commode;Tub/shower bench    Recommendations for Other Services       Precautions / Restrictions Precautions Precautions: Cervical Precaution Booklet Issued: Yes (comment) Required Braces or Orthoses: Cervical Brace Cervical Brace: Soft collar(see orders) Restrictions Weight Bearing Restrictions: No      Mobility Bed Mobility Overal bed mobility: Needs Assistance Bed Mobility: Rolling;Sidelying to Sit;Sit to Sidelying Rolling: Supervision Sidelying to sit: Supervision     Sit to sidelying: Supervision General bed mobility comments: instructed on log roll technique  Transfers Overall transfer level: Needs assistance Equipment used: None Transfers: Sit to/from Stand Sit to Stand: Min guard             Balance Overall balance assessment: Needs assistance Sitting-balance support: No upper extremity supported;Feet unsupported Sitting balance-Leahy Scale: Good     Standing balance  support: Single extremity supported;During functional activity Standing balance-Leahy Scale: Poor; requires external support                            ADL either performed or assessed with clinical judgement   ADL Overall ADL's : Needs assistance/impaired     Grooming: Set up;Supervision/safety;Sitting   Upper Body Bathing: Set up;Supervision/ safety;Sitting   Lower Body Bathing: Moderate assistance;Sit to/from stand   Upper Body Dressing : Minimal assistance;Sitting   Lower Body Dressing: Moderate assistance;Sit to/from stand   Toilet Transfer: Minimal assistance(simulated with RW)   Toileting- Clothing Manipulation and Hygiene: Minimal assistance Toileting - Clothing Manipulation Details (indicate cue type and reason): states incontinence has improved     Functional mobility during ADLs: Minimal assistance;Rolling walker General ADL Comments: Began educating pt on compensatory strategies; able to crosss R leg over knee but unable ot complete this with LLE due to weakness States he had several falls PTA     Vision         Perception     Praxis      Pertinent Vitals/Pain Pain Assessment: 0-10 Pain Score: 3  Faces Pain Scale: Hurts a little bit Pain Location: Anterior neck Pain Descriptors / Indicators: Discomfort Pain Intervention(s): Limited activity within patient's tolerance     Hand Dominance Right   Extremity/Trunk Assessment Upper Extremity Assessment Upper Extremity Assessment: Generalized weakness; reports sensory deficits B but improving; B hand function is mildly reduced. States it is "much better"   LLE weaker than R; mildly ataxic   Cervical / Trunk Assessment Cervical / Trunk Assessment: Other exceptions Cervical / Trunk Exceptions: ACDF  Communication Communication Communication: No difficulties   Cognition Arousal/Alertness: Awake/alert Behavior During Therapy: WFL for tasks assessed/performed Overall Cognitive Status: Within  Functional Limits for tasks assessed                                     General Comments       Exercises     Shoulder Instructions      Home Living Family/patient expects to be discharged to:: Private residence Living Arrangements: Spouse/significant other;Non-relatives/Friends Available Help at Discharge: Family Type of Home: House Home Access: Level entry     Home Layout: One level     Bathroom Shower/Tub: Chief Strategy OfficerTub/shower unit   Bathroom Toilet: Standard Bathroom Accessibility: Yes How Accessible: Accessible via walker Home Equipment: Gilmer MorCane - single point   Additional Comments: Patient states he lives with mother in law, 2 sons, 2 daughters, fiance, and brother in Social workerlaw.       Prior Functioning/Environment Level of Independence: Independent with assistive device(s)        Comments: Patient reports he has been using a SPC for ambulation. Independent with bathing. Unable to cook. Patient's son assists him with donning his pants.          OT Problem List: Decreased strength;Decreased range of motion;Impaired balance (sitting and/or standing);Decreased coordination;Decreased safety awareness;Decreased knowledge of use of DME or AE;Decreased knowledge of precautions;Impaired sensation;Impaired tone;Impaired UE functional use;Pain;Increased edema      OT Treatment/Interventions: Self-care/ADL training;Neuromuscular education;DME and/or AE instruction;Therapeutic activities;Patient/family education;Balance training    OT Goals(Current goals can be found in the care plan section) Acute Rehab OT Goals Patient Stated Goal: to be able to take care of myself OT Goal Formulation: With patient Time For Goal Achievement: 12/17/17 Potential to Achieve Goals: Good  OT Frequency: Min 3X/week   Barriers to D/C:            Co-evaluation              AM-PAC PT "6 Clicks" Daily Activity     Outcome Measure Help from another person eating meals?: None Help from  another person taking care of personal grooming?: None Help from another person toileting, which includes using toliet, bedpan, or urinal?: A Little Help from another person bathing (including washing, rinsing, drying)?: A Little Help from another person to put on and taking off regular upper body clothing?: A Little Help from another person to put on and taking off regular lower body clothing?: A Lot 6 Click Score: 19   End of Session Equipment Utilized During Treatment: Gait belt;Rolling walker Nurse Communication: Mobility status  Activity Tolerance: Other (comment)(limited by complaints of dizziness) Patient left: in bed;with call bell/phone within reach;with chair alarm set  OT Visit Diagnosis: Other abnormalities of gait and mobility (R26.89);Muscle weakness (generalized) (M62.81);History of falling (Z91.81);Pain Pain - part of body: (neck)                Time: 1450-1511 OT Time Calculation (min): 21 min Charges:  OT General Charges $OT Visit: 1 Visit OT Evaluation $OT Eval Moderate Complexity: 1 Mod G-Codes:     Everlina Gotts, OT/L  [phone] 12/03/2017  Anisa Leanos,HILLARY 12/03/2017, 4:09 PM

## 2017-12-03 NOTE — Progress Notes (Signed)
Pt weaker on left Dr Jordan LikesPool at bedside aware states was weaker pre op no rx ordered at this time

## 2017-12-04 ENCOUNTER — Encounter (HOSPITAL_COMMUNITY): Payer: Self-pay | Admitting: Neurosurgery

## 2017-12-04 MED ORDER — HYDROCODONE-ACETAMINOPHEN 5-325 MG PO TABS: 1.0000 | ORAL_TABLET | ORAL | 0 refills | Status: AC | PRN

## 2017-12-04 MED FILL — Thrombin For Soln 20000 Unit: CUTANEOUS | Qty: 1 | Status: AC

## 2017-12-04 MED FILL — Thrombin For Soln 5000 Unit: CUTANEOUS | Qty: 5000 | Status: AC

## 2017-12-04 NOTE — Discharge Instructions (Signed)

## 2017-12-04 NOTE — Progress Notes (Signed)
Pt received discharged paper, scripts, IV catheter removed and ride called while RN in room. Patient continues to state ride is on the way

## 2017-12-04 NOTE — Progress Notes (Signed)
Physical Therapy Treatment Patient Details Name: Dylan Gill MRN: [id number] DOB: 04/15/1981 Today's Date: 12/04/2017    History of Present Illness Pt. is a 37 y.o. M with no significant PMH on file with 2 week history of progressive bilateral lower extremity weakness, numbness/tingling, left sided cervical pain, lower back pain, and symptoms of urinary frequency and occasional incontinence. Found to have marked cervical stenosis with spinal cord signal abnormality at C4-5, C5-6 and C6-7 s/p ACDF C4-7.     PT Comments    Patient is progressing very well towards their physical therapy goals. Session focused on gait training with RW vs SPC vs no device. Patient stating he will be most likely to use his cane for household ambulation. Recommend use of AD due to patient poor dynamic balance. Continue to recommend follow up PT to address balance and functional strength deficits to maximize functional independence.     Follow Up Recommendations  Home health PT;Supervision for mobility/OOB     Equipment Recommendations  3in1 (PT)    Recommendations for Other Services       Precautions / Restrictions Precautions Precautions: Cervical Precaution Booklet Issued: Yes (comment) Required Braces or Orthoses: Cervical Brace Cervical Brace: Soft collar(see orders) Restrictions Weight Bearing Restrictions: No    Mobility  Bed Mobility Overal bed mobility: Modified Independent Bed Mobility: Rolling;Sidelying to Sit Rolling: Modified independent (Device/Increase time) Sidelying to sit: Modified independent (Device/Increase time)       General bed mobility comments: Modified independent with supine to sit  Transfers Overall transfer level: Needs assistance Equipment used: Rolling walker (2 wheeled);Straight cane Transfers: Sit to/from Stand Sit to Stand: Supervision            Ambulation/Gait Ambulation/Gait assistance: Min assist;Supervision Ambulation Distance (Feet): 320  Feet Assistive device: Straight cane;Rolling walker (2 wheeled);None Gait Pattern/deviations: Wide base of support;Antalgic   Gait velocity interpretation: Below normal speed for age/gender General Gait Details: Patient ambulating with RW initially the first 100 feet. Transitioned to no device for 20 feet and patient displayed decreased gait speed and scissoring gait, requiring minA by PT to correct. For remaining 200 feet, trialed SPC and patient displayed similar balance and gait speed as with RW. VC's for improved L foot clearance.    Stairs            Wheelchair Mobility    Modified Rankin (Stroke Patients Only)       Balance Overall balance assessment: Needs assistance Sitting-balance support: No upper extremity supported;Feet unsupported Sitting balance-Leahy Scale: Good     Standing balance support: During functional activity;No upper extremity supported Standing balance-Leahy Scale: Fair                              Cognition Arousal/Alertness: Awake/alert Behavior During Therapy: WFL for tasks assessed/performed Overall Cognitive Status: Within Functional Limits for tasks assessed                                        Exercises      General Comments General comments (skin integrity, edema, etc.): Patient donned and doffed slipper independently; reminders to avoid bending at neck and abduct/externally rotate LE to bring foot closer.       Pertinent Vitals/Pain Pain Assessment: Faces Faces Pain Scale: Hurts little more Pain Location: Low back Pain Descriptors / Indicators: Discomfort;Sore Pain Intervention(s): Limited activity within patient's  tolerance;Monitored during session    Home Living                      Prior Function            PT Goals (current goals can now be found in the care plan section) Acute Rehab PT Goals Patient Stated Goal: Improved balance PT Goal Formulation: With patient Time For Goal  Achievement: 12/17/17 Potential to Achieve Goals: Good Progress towards PT goals: Progressing toward goals    Frequency    Min 5X/week      PT Plan Current plan remains appropriate    Co-evaluation              AM-PAC PT "6 Clicks" Daily Activity  Outcome Measure  Difficulty turning over in bed (including adjusting bedclothes, sheets and blankets)?: None Difficulty moving from lying on back to sitting on the side of the bed? : A Little Difficulty sitting down on and standing up from a chair with arms (e.g., wheelchair, bedside commode, etc,.)?: A Little Help needed moving to and from a bed to chair (including a wheelchair)?: A Little Help needed walking in hospital room?: A Little Help needed climbing 3-5 steps with a railing? : A Lot 6 Click Score: 18    End of Session Equipment Utilized During Treatment: Gait belt Activity Tolerance: Patient tolerated treatment well Patient left: in bed;with call bell/phone within reach   PT Visit Diagnosis: Unsteadiness on feet (R26.81);Difficulty in walking, not elsewhere classified (R26.2)     Time: [id number] PT Time Calculation (min) (ACUTE ONLY): 17 min  Charges:  $Gait Training: 8-22 mins                    G Codes:       Laurina Bustlearoline Sherryll Skoczylas, PT, DPT Acute Rehabilitation Services  Pager: [phone]    Vanetta MuldersCarloine H Mikeala Girdler 12/04/2017, 11:51 AM

## 2017-12-04 NOTE — Progress Notes (Signed)
Occupational Therapy Treatment Patient Details Name: Dylan Gill MRN: [id number] DOB: 1981/02/08 Today's Date: 12/04/2017    History of present illness Pt. is a 37 y.o. M with no significant PMH on file with 2 week history of progressive bilateral lower extremity weakness, numbness/tingling, left sided cervical pain, lower back pain, and symptoms of urinary frequency and occasional incontinence. Found to have marked cervical stenosis with spinal cord signal abnormality at C4-5, C5-6 and C6-7 s/p ACDF C4-7.    OT comments  Completed education regarding compensatory strategies for ADL and use of ADM and AE. Pt able to return demonstrate. Pt safe to DC home with intermittent S when medically stable. Pt issed AE. Will need 3in1 and shower chair for DC home.   Follow Up Recommendations  No OT follow up;Supervision - Intermittent    Equipment Recommendations  3 in 1 bedside commode;Tub/shower bench    Recommendations for Other Services      Precautions / Restrictions Precautions Precautions: Cervical Precaution Booklet Issued: Yes (comment) Required Braces or Orthoses: Cervical Brace Cervical Brace: Soft collar(see orders) Restrictions Weight Bearing Restrictions: No       Mobility Bed Mobility Overal bed mobility: Modified Independent Bed Mobility: Rolling;Sidelying to Sit Rolling: Modified independent (Device/Increase time) Sidelying to sit: Modified independent (Device/Increase time)       General bed mobility comments: Modified independent with supine to sit  Transfers Overall transfer level: Needs assistance Equipment used: Rolling walker (2 wheeled);Straight cane Transfers: Sit to/from Stand Sit to Stand: Supervision              Balance Overall balance assessment: Needs assistance Sitting-balance support: No upper extremity supported;Feet unsupported Sitting balance-Leahy Scale: Good     Standing balance support: During functional activity;No upper  extremity supported Standing balance-Leahy Scale: Fair                             ADL either performed or assessed with clinical judgement   ADL Overall ADL's : Needs assistance/impaired                                     Functional mobility during ADLs: Supervision/safety;Rolling walker General ADL Comments: Pt doing better todayand able to cross B legs over knees; Issued reacherto retrieve items form floor if needed. Also issued longhandled sponge Educated on home safety and need for use of shower chair when bathing cue to apparent balance deeficits. Pt verbalized understanding. Also educated on management of collar for ADL. Pt verbalized understanding.      Vision       Perception     Praxis      Cognition Arousal/Alertness: Awake/alert Behavior During Therapy: WFL for tasks assessed/performed Overall Cognitive Status: Within Functional Limits for tasks assessed                                          Exercises     Shoulder Instructions       General Comments Patient donned and doffed slipper independently; reminders to avoid bending at neck and abduct/externally rotate LE to bring foot closer.     Pertinent Vitals/ Pain       Pain Assessment: 0-10 Pain Score: 4  Faces Pain Scale: Hurts little more Pain Location: Low back Pain Descriptors /  Indicators: Discomfort;Sore Pain Intervention(s): Limited activity within patient's tolerance  Home Living                                          Prior Functioning/Environment              Frequency           Progress Toward Goals  OT Goals(current goals can now be found in the care plan section)  Progress towards OT goals: Goals met/education completed, patient discharged from OT  Acute Rehab OT Goals Patient Stated Goal: Improved balance OT Goal Formulation: With patient Potential to Achieve Goals: Good ADL Goals Pt Will Perform Lower Body  Bathing: with modified independence;with adaptive equipment;sit to/from stand Pt Will Perform Lower Body Dressing: with modified independence;sit to/from stand;with adaptive equipment Pt Will Transfer to Toilet: with modified independence;ambulating;bedside commode Pt Will Perform Tub/Shower Transfer: with modified independence;grab bars;3 in 1;ambulating Additional ADL Goal #1: Pt will independently verbalize 3 cervical precautions and donn/doff soft collar independently  Plan Discharge plan remains appropriate    Co-evaluation                 AM-PAC PT "6 Clicks" Daily Activity     Outcome Measure   Help from another person eating meals?: None Help from another person taking care of personal grooming?: None Help from another person toileting, which includes using toliet, bedpan, or urinal?: None Help from another person bathing (including washing, rinsing, drying)?: A Little Help from another person to put on and taking off regular upper body clothing?: None Help from another person to put on and taking off regular lower body clothing?: A Little 6 Click Score: 22    End of Session Equipment Utilized During Treatment: Rolling walker;Cervical collar  OT Visit Diagnosis: Other abnormalities of gait and mobility (R26.89);Muscle weakness (generalized) (M62.81);History of falling (Z91.81);Pain Pain - part of body: (neck/back)   Activity Tolerance Patient tolerated treatment well   Patient Left in bed;with call bell/phone within reach   Nurse Communication Mobility status        Time: 0263-7858 OT Time Calculation (min): 16 min  Charges: OT General Charges $OT Visit: 1 Visit OT Treatments $Self Care/Home Management : 8-22 mins  [facility] OT/L  [phone] 12/04/2017   [name],HILLARY 12/04/2017, 12:02 PM

## 2017-12-04 NOTE — Care Management Note (Addendum)
Case Management Note  Patient Details  Name: Dylan Gill MRN: [id number] Date of Birth: [birth year]  Subjective/Objective:     Pt s/p anterior cervical fusion. He is from home with spouse.                Action/Plan: Pt has orders for shower stool and 3 in 1. [facility]up[facility]Donna [facility] notified and will deliver to the room.  Pt with recommendations for HHPT. MD did not place orders and patient does not feel he needs this service.  Pt has transportation home.   Expected Discharge Date:  12/04/17               Expected Discharge Plan:  Home/Self Care  In-House Referral:     Discharge planning Services  CM Consult  Post Acute Care Choice:  Durable Medical Equipment Choice offered to:  Patient  DME Arranged:  3-N-1, Shower stool DME Agency:  [facility]  HH [facility]    [facility] Agency:     Status of Service:  Completed, signed off  If discussed at [employer] Length of Stay Meetings, dates discussed:    Additional Comments:  Kermit BaloKelli F Dlynn Ranes, RN 12/04/2017, 10:40 AM

## 2017-12-04 NOTE — Discharge Summary (Signed)
Physician Discharge Summary  Patient ID: Dylan Gill MRN: [id number] DOB/AGE: 38-Nov-1982 36 y.o.  Admit date: 12/01/2017 Discharge date: 12/04/2017  Admission Diagnoses:  Discharge Diagnoses:  Active Problems:   Cervical myelopathy [facility](HCC)   Discharged Condition: good  Hospital Course: Patient admitted through the emergency department for evaluation of severe progressive cervical myelopathy.  Workup demonstrates evidence of marked cervical stenosis with spinal cord compression.  Patient underwent urgent 3 level anterior cervical decompression and fusion with good results.  Postoperatively patient much improved.  Upper extremity strength sensation much better.  Still with a little bit of left lower extremity spastic weakness but this is also much improved.  Patient is ambulating with minimal difficulty.  Ready for discharge home.  Consults:   Significant Diagnostic Studies:   Treatments:   Discharge Exam: Blood pressure (!) 111/57, pulse 96, temperature 97.8 F (36.6 C), temperature source Oral, resp. rate 18, height 6' 1.5" (1.867 m), weight 108.3 kg (238 lb 12.1 oz), SpO2 96 %. Awake and alert.  Oriented and appropriate.  Speech fluent.  Judgment insight intact.  Motor examination 5/5 bilateral upper extremity.  Motor examination of lower extremities 4+5 left lower extremity spastic weakness.  Improved from preop.  Sensory examination nonfocal.  Wound clean and dry.  Neck soft.  Disposition: Discharge disposition: 01-Home or Self Care        Allergies as of 12/04/2017   No Known Allergies     Medication List    STOP taking these medications   ketorolac 10 MG tablet Commonly known as:  TORADOL     TAKE these medications   cyclobenzaprine 10 MG tablet Commonly known as:  FLEXERIL Take 10 mg by mouth 3 (three) times daily as needed for muscle spasms.   HYDROcodone-acetaminophen 5-325 MG tablet Commonly known as:  NORCO/VICODIN Take 1-2 tablets by mouth every 4  (four) hours as needed for moderate pain ((score 4 to 6)). What changed:    how much to take  when to take this  reasons to take this      Follow-up Information    Julio SicksPool, Solimar Maiden, MD. Schedule an appointment as soon as possible for a visit in 1 week(s).   Specialty:  Neurosurgery Contact information: [address] [place] [address] [phone]           Signed: Temple PaciniHenry A Trayce Caravello 12/04/2017, 8:11 AM

## 2019-08-23 IMAGING — RF DG CERVICAL SPINE 1V
1 series · 2 of 2 positions shown · non-contrast
Comparison: MRI 12/02/2017

CLINICAL DATA: C4-C7 ACDF

EXAM:
DG C-ARM 61-120 MIN; DG CERVICAL SPINE - 1 VIEW

[Series 1: run · 2 of 2 slices shown]
[im 1/2]
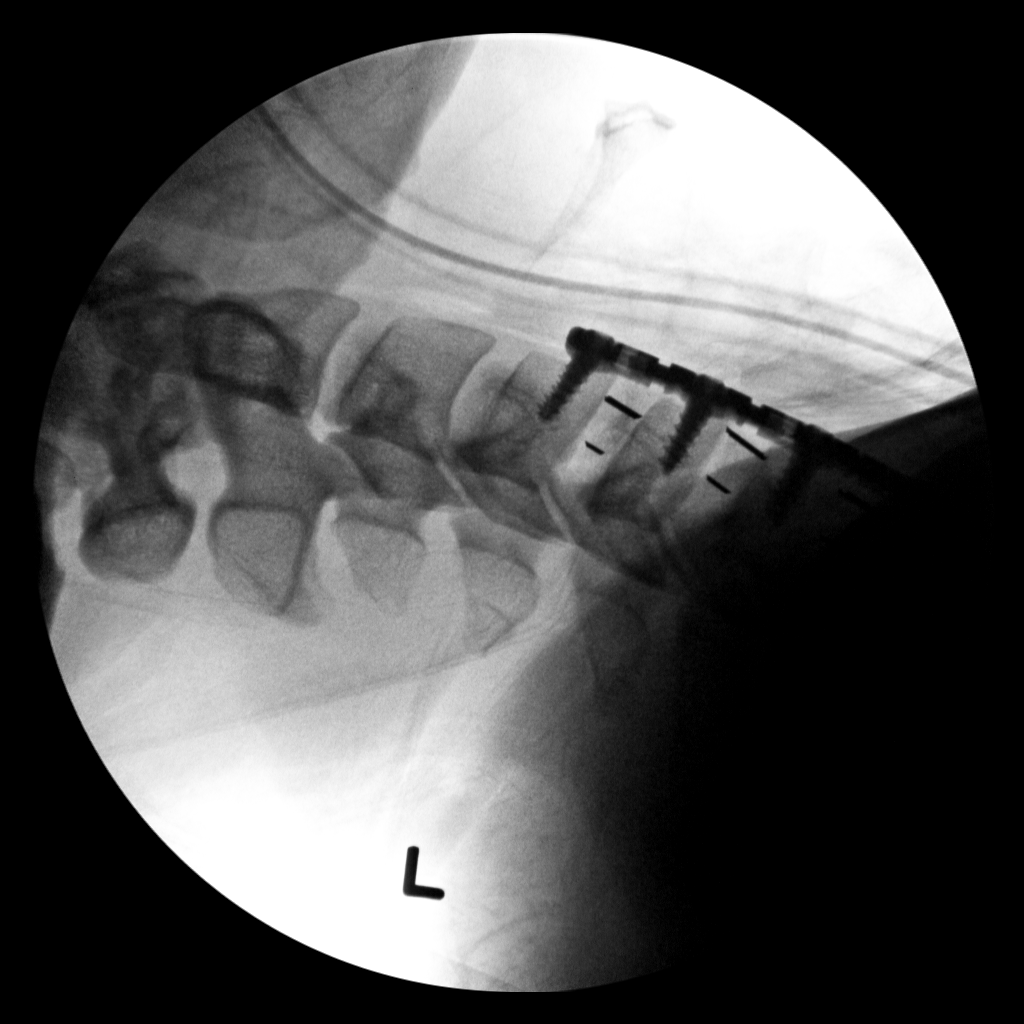
[im 2/2]
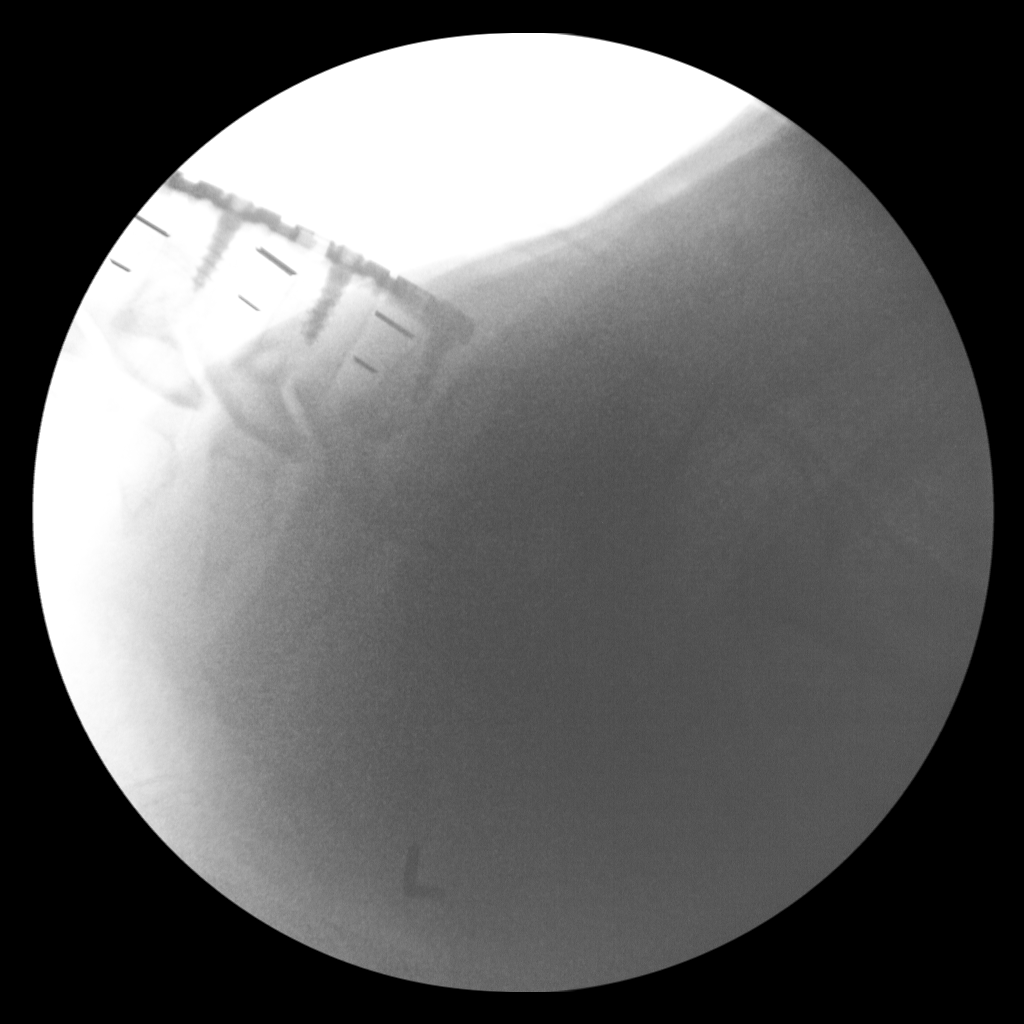

[2 of 2 positions shown; findings below may reference images not displayed]

FINDINGS: C4-C7 ACDF. The C7 vertebral body is difficult to visualize due to
overlying shoulders. Normal alignment. No visible complicating
feature.
IMPRESSION: C4-C7 ACDF. No visible complicating feature. C7 not well visualized
due to overlying shoulders.
